# Patient Record
Sex: Female | Born: 2000
Health system: Southern US, Community
[De-identification: ages and names within clinical notes are randomized; demographics above are authoritative.]

## PROBLEM LIST (undated history)

## (undated) DIAGNOSIS — Z8669 Personal history of other diseases of the nervous system and sense organs: Secondary | ICD-10-CM

## (undated) HISTORY — PX: NO PAST SURGERIES: SHX2092

---
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
## 2021-01-30 ENCOUNTER — Ambulatory Visit
Admission: RE | Admit: 2021-01-30 | Discharge: 2021-01-30 | Disposition: A | Discharge status: Home / Self Care | Payer: BC Managed Care – PPO | Source: Ambulatory Visit | Attending: Emergency Medicine | Admitting: Emergency Medicine

## 2021-01-30 ENCOUNTER — Other Ambulatory Visit: Payer: Self-pay

## 2021-01-30 VITALS — BP 146/128 | HR 70 | Temp 98.7°F | Resp 18

## 2021-01-30 DIAGNOSIS — H00015 Hordeolum externum left lower eyelid: Secondary | ICD-10-CM

## 2021-01-30 MED ORDER — ERYTHROMYCIN 5 MG/GM OP OINT: TOPICAL_OINTMENT | OPHTHALMIC | 0 refills | Status: DC

## 2021-01-30 NOTE — ED Provider Notes (Signed)
[facility]    CSN: [id number] Arrival date & time: 01/30/21  [id number]      History   Chief Complaint Chief Complaint  Patient presents with   Eye Problem    HPI Chelsea Sanchez is a 20 y.o. female.   HPI  20 year old female here for evaluation of swollen left thigh.  Patient reports that she initially had swelling of her left upper eyelid 2 weeks ago that resulted in a stye.  The stye ruptured on its own and the swelling has mostly resolved.  Swelling to her lower eyelid with a stye forming near the inner canthus.  She has been using warm compresses without any improvement and the stye has not ruptured on its own.  She denies any changes to her vision.  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  She states her eye itches and the eyelid is painful.  Past Medical History:  Diagnosis Date   History of migraine     There are no problems to display for this patient.   Past Surgical History:  Procedure Laterality Date   NO PAST SURGERIES      OB History     Gravida  0   Para  0   Term  0   Preterm  0   AB  0   Living  0      SAB  0   IAB  0   Ectopic  0   Multiple  0   Live Births  0            Home Medications    Prior to Admission medications   Medication Sig Start Date End Date Taking? Authorizing Provider  erythromycin ophthalmic ointment Place a 1/2 inch ribbon of ointment into the lower eyelid. 01/30/21  Yes Margarette Canada, NP  etonogestrel (NEXPLANON) 68 MG IMPL implant 1 each by Subdermal route once. 07/12/20 07/13/23  [provider]    Family History Family History  Problem Relation Age of Onset   Healthy Mother    Multiple sclerosis Father    Hypertension Maternal Grandmother    Breast cancer Maternal Grandmother    Kidney disease Maternal Grandmother     Social History Social History   Tobacco Use   Smoking status: Never   Smokeless tobacco: Never   Vaping Use   Vaping Use: Former   Substances: Flavoring  Substance Use Topics   Alcohol use: Yes    Comment: occasional   Drug use: Never     Allergies   Copper-containing compounds and Nickel   Review of Systems Review of Systems  Constitutional:  Negative for activity change, appetite change and fever.  Eyes:  Positive for pain and itching. Negative for photophobia, discharge, redness and visual disturbance.  Skin:  Positive for color change.  Hematological: Negative.   Psychiatric/Behavioral: Negative.      Physical Exam Triage Vital Signs ED Triage Vitals  Enc Vitals Group     BP 01/30/21 0845 (!) 146/128     Pulse Rate 01/30/21 0845 70     Resp 01/30/21 0845 18     Temp 01/30/21 0845 98.7 F (37.1 C)     Temp Source 01/30/21 0845 Oral     SpO2 01/30/21 0845 100 %     Weight --      Height --      Head Circumference --      Peak  Flow --      Pain Score 01/30/21 0843 5     Pain Loc --      Pain Edu? --      Excl. in [place]? --    No data found.  Updated Vital Signs BP (!) 146/128 (BP Location: Right Arm)    Pulse 70    Temp 98.7 F (37.1 C) (Oral)    Resp 18    SpO2 100%   Visual Acuity Right Eye Distance:   Left Eye Distance:   Bilateral Distance:    Right Eye Near:   Left Eye Near:    Bilateral Near:     Physical Exam Vitals and nursing note reviewed.  Constitutional:      General: She is not in acute distress.    Appearance: Normal appearance. She is not ill-appearing.  HENT:     Head: Normocephalic and atraumatic.  Eyes:     General: No scleral icterus.       Right eye: No discharge.        Left eye: No discharge.     Extraocular Movements: Extraocular movements intact.     Conjunctiva/sclera: Conjunctivae normal.     Pupils: Pupils are equal, round, and reactive to light.  Skin:    General: Skin is warm and dry.     Capillary Refill: Capillary refill takes less than 2 seconds.     Findings: Erythema present.  Neurological:      General: No focal deficit present.     Mental Status: She is alert and oriented to person, place, and time.  Psychiatric:        Mood and Affect: Mood normal.        Behavior: Behavior normal.        Thought Content: Thought content normal.        Judgment: Judgment normal.     UC Treatments / Results  Labs (all labs ordered are listed, but only abnormal results are displayed) Labs Reviewed - No data to display  EKG   Radiology No results found.  Procedures Procedures (including critical care time)  Medications Ordered in UC Medications - No data to display  Initial Impression / Assessment and Plan / UC Course  I have reviewed the triage vital signs and the nursing notes.  Pertinent labs & imaging results that were available during my care of the patient were reviewed by me and considered in my medical decision making (see chart for details).  Patient is a nontoxic-appearing 20 year old female here for evaluation of swelling to her left lower eyelid.  She has been experiencing swelling to both eyelids for the past 2 weeks.  The upper eyelid initially developed into a stye, which ruptured on its own, and has largely resolved.  There is some remaining mild edema to the upper eyelid without erythema.  There is a small red mark along the eyelid margin where the stye once was.  There is no further lesions visualized along the eyelid margin or under the eyelid.  The left lower eyelid has erythema and edema that is more prominent near the inner canthus.  No fluctuance or induration noted along the outer perimeter of the eyelid but there is induration and fluctuance noted near the inner canthus.  There is an obvious hordeolum externum with a head that is formed.  The stye is tender to palpation.  I offered the patient the option of having the stye drained here in the clinic or continuing warm compresses at  home with the hopes that it would rupture on its own.  Patient states that she would  prefer to let rupture on its own so I will discharge her home on erythromycin eye ointment and have instructed her to perform eyelid hygiene twice daily with baby shampoo and vigorous eyelid scrubbing.  She is to play half-inch ribbon to the eyelid margin twice daily until the stye resolves.  I have informed her that if the swelling continues and the stye does not rupture on its own she needs to return for reevaluation and possible I&D or follow-up with her eye doctor.  Patient verbalizes understanding of same.  Work note provided.   Final Clinical Impressions(s) / UC Diagnoses   Final diagnoses:  Hordeolum externum of left lower eyelid     Discharge Instructions      Continue to apply warm compresses.  Perform eyelid hygiene with baby shampoo in a rich lather and vigorous scrubbing of your eyelids twice daily. Rinse thoroughly with water.  Apply Erythromycin ointment to the eyelid margin with a clean Q-tip twice daily, and a 1/2 inch ribbon to the inside of the lower lid. Blink several times to liquify the ointment and spread it ober the inside of your eyelids.  If your symptoms do not improve, or you develop changes in your vision follow up with your eye doctor.       ED Prescriptions     Medication Sig Dispense Auth. Provider   erythromycin ophthalmic ointment Place a 1/2 inch ribbon of ointment into the lower eyelid. 3.5 g Becky Augusta, NP      PDMP not reviewed this encounter.   Becky Augusta, NP 01/30/21 [phone]

## 2021-01-30 NOTE — Discharge Instructions (Signed)
Continue to apply warm compresses. ° °Perform eyelid hygiene with baby shampoo in a rich lather and vigorous scrubbing of your eyelids twice daily. Rinse thoroughly with water. ° °Apply Erythromycin ointment to the eyelid margin with a clean Q-tip twice daily, and a 1/2 inch ribbon to the inside of the lower lid. Blink several times to liquify the ointment and spread it ober the inside of your eyelids. ° °If your symptoms do not improve, or you develop changes in your vision follow up with your eye doctor.   °

## 2021-01-30 NOTE — ED Triage Notes (Signed)
Pt presents with swelling of eyelid x 2 weeks. States it started with swelling/stye on upper eye lid which drained then had start on lower lid.  Has used warm compresses and a cream with no improvement.  No vision changes.

## 2021-01-31 ENCOUNTER — Ambulatory Visit (INDEPENDENT_AMBULATORY_CARE_PROVIDER_SITE_OTHER): Payer: BC Managed Care – PPO | Admitting: Obstetrics and Gynecology

## 2021-01-31 ENCOUNTER — Encounter: Payer: Self-pay | Admitting: Obstetrics and Gynecology

## 2021-01-31 VITALS — BP 118/64 | Ht 64.0 in | Wt 127.0 lb

## 2021-01-31 DIAGNOSIS — Z30011 Encounter for initial prescription of contraceptive pills: Secondary | ICD-10-CM

## 2021-01-31 DIAGNOSIS — Z3046 Encounter for surveillance of implantable subdermal contraceptive: Secondary | ICD-10-CM

## 2021-01-31 MED ORDER — MICROGESTIN 24 FE 1-20 MG-MCG PO TABS: 1.0000 | ORAL_TABLET | Freq: Every day | ORAL | 2 refills | Status: DC

## 2021-01-31 NOTE — Progress Notes (Signed)
[place], [employer] Complaint  Patient presents with   Contraception    Nexplanon removal due to frequent headaches, tenderness in breasts, interested in OCPs    HPI:      [name] is a 20 y.o. G0P0000 whose LMP was No LMP recorded. Patient has had an implant., presents today for nexplanon removal due to frequent headaches and breast tenderness. Had migraines without aura before nexplanon, but less frequent. Nexplanon placed 6/22, instructed pt to give it time. Pt's sx not improved. Would like to do OCPs; never done them before. No hx of migraines with aura.  She is sex active.   Past Medical History:  Diagnosis Date   History of migraine     Past Surgical History:  Procedure Laterality Date   NO PAST SURGERIES      Family History  Problem Relation Age of Onset   Healthy Mother    Multiple sclerosis Father    Hypertension Maternal Grandmother    Breast cancer Maternal Grandmother        not sure of age   Kidney disease Maternal Grandmother     Social History   Socioeconomic History   Marital status: Single    Spouse name: Not on file   Number of children: Not on file   Years of education: Not on file   Highest education level: Not on file  Occupational History   Not on file  Tobacco Use   Smoking status: Never   Smokeless tobacco: Never  Vaping Use   Vaping Use: Former   Substances: Flavoring  Substance and Sexual Activity   Alcohol use: Yes    Comment: occasional   Drug use: Never   Sexual activity: Yes    Birth control/protection: Implant, Condom  Other Topics Concern   Not on file  Social History Narrative   Not on file   Social Determinants of Health   Financial Resource Strain: Not on file  Food Insecurity: Not on file  Transportation Needs: Not on file  Physical Activity: Not on file  Stress: Not on file  Social Connections: Not on file  Intimate Partner Violence: Not on file    Outpatient Medications  Prior to Visit  Medication Sig Dispense Refill   erythromycin ophthalmic ointment Place a 1/2 inch ribbon of ointment into the lower eyelid. 3.5 g 0   etonogestrel (NEXPLANON) 68 MG IMPL implant 1 each by Subdermal route once.     No facility-administered medications prior to visit.      ROS:  Review of Systems  Constitutional:  Negative for fever.  Gastrointestinal:  Negative for blood in stool, constipation, diarrhea, nausea and vomiting.  Genitourinary:  Negative for dyspareunia, dysuria, flank pain, frequency, hematuria, urgency, vaginal bleeding, vaginal discharge and vaginal pain.  Musculoskeletal:  Negative for back pain.  Skin:  Negative for rash.  Neurological:  Positive for headaches.  BREAST: No symptoms   OBJECTIVE:   Vitals:  BP 118/64    Ht 5\' 4"  (1.626 m)    Wt 127 lb (57.6 kg)    BMI 21.80 kg/m   Physical Exam Constitutional:      Appearance: Normal appearance.  Pulmonary:     Effort: Pulmonary effort is normal.  Musculoskeletal:        General: Normal range of motion.  Neurological:     Mental Status: She is alert and oriented to person, place, and time.  Psychiatric:  Judgment: Judgment normal.    Nexplanon removal Procedure note - The Nexplanon was noted in the patient's arm and the end was identified. The skin was cleansed with a Betadine solution. A small injection of subcutaneous lidocaine with epinephrine was given over the end of the implant. An incision was made at the end of the implant. The rod was noted in the incision and grasped with a hemostat. It was noted to be intact.  Steri-Strip was placed approximating the incision. Hemostasis was noted.  Assessment/Plan: Encounter for initial prescription of contraceptive pills - Plan: Norethindrone Acetate-Ethinyl Estrad-FE (MICROGESTIN 24 FE) 1-20 MG-MCG(24) tablet; OCP start today, condoms for 1 mo. Rx eRxd. F/u prn/RTO for 6/23 annual.   Nexplanon removal--Remove the dressing in 24 hours,   keep the incision area dry for 24 hours and remove the Steristrip in 2-3  days.  Notify us if any signs of tenderness, redness, pain, or fevers develop.   Meds ordered this encounter  Medications   Norethindrone Acetate-Ethinyl Estrad-FE (MICROGESTIN 24 FE) 1-20 MG-MCG(24) tablet    Sig: Take 1 tablet by mouth daily.    Dispense:  84 tablet    Refill:  2    Order Specific Question:   Supervising Provider    Answer:   Nadara Mustard [[id number]]      Return if symptoms worsen or fail to improve.  Monnica Saltsman B. Dary Dilauro, PA-C 01/31/2021 4:17 PM

## 2021-01-31 NOTE — Patient Instructions (Signed)
I value your feedback and you entrusting us with your care. If you get a [facility] patient survey, I would appreciate you taking the time to let us know about your experience today. Thank you!  Remove the dressing in 24 hours,  keep the incision area dry for 24 hours and remove the Steristrip in 2-3  days.  Notify us if any signs of tenderness, redness, pain, or fevers develop.   

## 2021-02-16 ENCOUNTER — Ambulatory Visit
Admission: EM | Admit: 2021-02-16 | Discharge: 2021-02-16 | Disposition: A | Discharge status: Home / Self Care | Payer: BC Managed Care – PPO | Attending: Physician Assistant | Admitting: Physician Assistant

## 2021-02-16 ENCOUNTER — Encounter: Payer: Self-pay | Admitting: Emergency Medicine

## 2021-02-16 ENCOUNTER — Other Ambulatory Visit: Payer: Self-pay

## 2021-02-16 DIAGNOSIS — R300 Dysuria: Secondary | ICD-10-CM | POA: Diagnosis not present

## 2021-02-16 DIAGNOSIS — N3001 Acute cystitis with hematuria: Secondary | ICD-10-CM | POA: Insufficient documentation

## 2021-02-16 LAB — URINALYSIS, COMPLETE (UACMP) WITH MICROSCOPIC
Bilirubin Urine: NEGATIVE
Glucose, UA: NEGATIVE mg/dL
Ketones, ur: NEGATIVE mg/dL
Nitrite: NEGATIVE
Protein, ur: 100 mg/dL — AB
RBC / HPF: >50 RBC/hpf (ref 0–5)
Specific Gravity, Urine: >1.03 — ABNORMAL HIGH (ref 1.005–1.030)
pH: 5.5 (ref 5.0–8.0)

## 2021-02-16 LAB — PREGNANCY, URINE: Preg Test, Ur: NEGATIVE

## 2021-02-16 MED ORDER — PHENAZOPYRIDINE HCL 200 MG PO TABS: 200.0000 mg | ORAL_TABLET | Freq: Three times a day (TID) | ORAL | 0 refills | Status: DC

## 2021-02-16 MED ORDER — CEPHALEXIN 500 MG PO CAPS: 500.0000 mg | ORAL_CAPSULE | Freq: Four times a day (QID) | ORAL | 0 refills | Status: DC

## 2021-02-16 MED ORDER — FLUCONAZOLE 150 MG PO TABS: 150.0000 mg | ORAL_TABLET | Freq: Every day | ORAL | 0 refills | Status: DC

## 2021-02-16 MED ORDER — PHENAZOPYRIDINE HCL 200 MG PO TABS
200.0000 mg | ORAL_TABLET | Freq: Once | ORAL | Status: AC
  Administered 2021-02-16: 200 mg via ORAL

## 2021-02-16 NOTE — Discharge Instructions (Addendum)
Rest, push fluids, take antibiotic as directed.  Pyridium will make your urine turn orange.  Follow-up with your PCP next week for recheck.  Go to ER if you develop nausea, vomiting, fever, flank pain, etc.

## 2021-02-16 NOTE — ED Provider Notes (Signed)
[facility]    CSN: [id number] Arrival date & time: 02/16/21  0936      History   Chief Complaint Chief Complaint  Patient presents with   Dysuria    HPI ONELL CHRISTENSEN is a 21 y.o. female.   Audry Riles, 21 year old female, presents to urgent care with chief complaint of dysuria, hematuria, frequency that started last night, recently changed birth control, denies vaginal bleeding or STI concerns. No prior UTI.  Patient states she is taking AZO cranberry without relief.  The history is provided by the patient. No language interpreter was used.   Past Medical History:  Diagnosis Date   History of migraine     Patient Active Problem List   Diagnosis Date Noted   Dysuria 02/16/2021   Acute cystitis with hematuria 02/16/2021    Past Surgical History:  Procedure Laterality Date   NO PAST SURGERIES      OB History     Gravida  0   Para  0   Term  0   Preterm  0   AB  0   Living  0      SAB  0   IAB  0   Ectopic  0   Multiple  0   Live Births  0            Home Medications    Prior to Admission medications   Medication Sig Start Date End Date Taking? Authorizing Provider  cephALEXin (KEFLEX) 500 MG capsule Take 1 capsule (500 mg total) by mouth 4 (four) times daily. [id number]  Yes Mohsin Crum, Jeanett Schlein, NP  fluconazole (DIFLUCAN) 150 MG tablet Take 1 tablet (150 mg total) by mouth daily. Take day 0, 3,7 [id number]  Yes Barby Colvard, Jeanett Schlein, NP  Norethindrone Acetate-Ethinyl Estrad-FE (MICROGESTIN 24 FE) 1-20 MG-MCG(24) tablet Take 1 tablet by mouth daily. [id number]  Yes Copland, Deirdre Evener, PA-C  phenazopyridine (PYRIDIUM) 200 MG tablet Take 1 tablet (200 mg total) by mouth 3 (three) times daily. [id number]  Yes Khristian Phillippi, Jeanett Schlein, NP  erythromycin ophthalmic ointment Place a 1/2 inch ribbon of ointment into the lower eyelid. 01/30/21   Margarette Canada, NP  etonogestrel (NEXPLANON) 68 MG IMPL implant 1 each by Subdermal route once. 07/12/20  07/13/23  [provider]    Family History Family History  Problem Relation Age of Onset   Healthy Mother    Multiple sclerosis Father    Hypertension Maternal Grandmother    Breast cancer Maternal Grandmother        not sure of age   Kidney disease Maternal Grandmother     Social History Social History   Tobacco Use   Smoking status: Never   Smokeless tobacco: Never  Vaping Use   Vaping Use: Former   Substances: Flavoring  Substance Use Topics   Alcohol use: Yes    Comment: occasional   Drug use: Never     Allergies   Copper-containing compounds and Nickel   Review of Systems Review of Systems  Constitutional:  Negative for chills.  HENT:  Negative for ear pain and sore throat.   Eyes:  Negative for pain and visual disturbance.  Respiratory:  Negative for cough and shortness of breath.   Cardiovascular:  Negative for chest pain and palpitations.  Gastrointestinal:  Negative for abdominal pain and vomiting.  Genitourinary:  Positive for dysuria. Negative for hematuria, vaginal bleeding and vaginal discharge.  Musculoskeletal:  Negative for arthralgias and back pain.  Skin:  Negative  for color change and rash.  Neurological:  Negative for seizures and syncope.  All other systems reviewed and are negative.   Physical Exam Triage Vital Signs ED Triage Vitals  Enc Vitals Group     BP 02/16/21 0956 106/70     Pulse Rate 02/16/21 0956 73     Resp 02/16/21 0956 18     Temp 02/16/21 0956 98.7 F (37.1 C)     Temp Source 02/16/21 0956 Oral     SpO2 02/16/21 0956 98 %     Weight 02/16/21 0954 126 lb 15.8 oz (57.6 kg)     Height 02/16/21 0954 5\' 4"  (1.626 m)     Head Circumference --      Peak Flow --      Pain Score 02/16/21 0953 10     Pain Loc --      Pain Edu? --      Excl. in [place]? --    No data found.  Updated Vital Signs BP 106/70 (BP Location: Right Arm)    Pulse 73    Temp 98.7 F (37.1 C) (Oral)    Resp 18    Ht 5\' 4"  (1.626 m)    Wt 126  lb 15.8 oz (57.6 kg)    SpO2 98%    BMI 21.80 kg/m   Visual Acuity Right Eye Distance:   Left Eye Distance:   Bilateral Distance:    Right Eye Near:   Left Eye Near:    Bilateral Near:     Physical Exam Vitals and nursing note reviewed.  Constitutional:      General: She is not in acute distress.    Appearance: Normal appearance. She is well-developed and well-groomed.  HENT:     Head: Normocephalic.  Eyes:     Pupils: Pupils are equal, round, and reactive to light.  Cardiovascular:     Rate and Rhythm: Normal rate and regular rhythm.     Pulses: Normal pulses.     Heart sounds: Normal heart sounds.  Pulmonary:     Effort: Pulmonary effort is normal.     Breath sounds: Normal breath sounds and air entry.  Abdominal:     General: Bowel sounds are normal.     Palpations: Abdomen is soft.     Tenderness: There is no abdominal tenderness.  Musculoskeletal:        General: Normal range of motion.     Cervical back: Normal range of motion.  Skin:    General: Skin is warm and dry.  Neurological:     General: No focal deficit present.     Mental Status: She is alert and oriented to person, place, and time.     GCS: GCS eye subscore is 4. GCS verbal subscore is 5. GCS motor subscore is 6.  Psychiatric:        Attention and Perception: Attention normal.        Mood and Affect: Mood normal.        Speech: Speech normal.        Behavior: Behavior normal. Behavior is cooperative.     UC Treatments / Results  Labs (all labs ordered are listed, but only abnormal results are displayed) Labs Reviewed  URINALYSIS, COMPLETE (UACMP) WITH MICROSCOPIC - Abnormal; Notable for the following components:      Result Value   Color, Urine AMBER (*)    APPearance CLOUDY (*)    Specific Gravity, Urine >1.030 (*)    Hgb urine dipstick  LARGE (*)    Protein, ur 100 (*)    Leukocytes,Ua SMALL (*)    Bacteria, UA MANY (*)    All other components within normal limits  URINE CULTURE   PREGNANCY, URINE    EKG   Radiology No results found.  Procedures Procedures (including critical care time)  Medications Ordered in UC Medications  phenazopyridine (PYRIDIUM) tablet 200 mg (200 mg Oral Given 02/16/21 1051)    Initial Impression / Assessment and Plan / UC Course  I have reviewed the triage vital signs and the nursing notes.  Pertinent labs & imaging results that were available during my care of the patient were reviewed by me and considered in my medical decision making (see chart for details).     Ddx: UTI, kidney stone, pyelonephritis Final Clinical Impressions(s) / UC Diagnoses   Final diagnoses:  Dysuria  Acute cystitis with hematuria     Discharge Instructions      Rest, push fluids, take antibiotic as directed.  Pyridium will make your urine turn orange.  Follow-up with your PCP next week for recheck.  Go to ER if you develop nausea, vomiting, fever, flank pain, etc.     ED Prescriptions     Medication Sig Dispense Auth. Provider   cephALEXin (KEFLEX) 500 MG capsule Take 1 capsule (500 mg total) by mouth 4 (four) times daily. 40 capsule Kerria Sapien, NP   phenazopyridine (PYRIDIUM) 200 MG tablet Take 1 tablet (200 mg total) by mouth 3 (three) times daily. 6 tablet Donnalyn Juran, NP   fluconazole (DIFLUCAN) 150 MG tablet Take 1 tablet (150 mg total) by mouth daily. Take day 0, 3,7 3 tablet Lennox Leikam, Jeanett Schlein, NP      PDMP not reviewed this encounter.   Tori Milks, NP [id number]

## 2021-02-16 NOTE — ED Triage Notes (Signed)
Pt c/o dysuria, hematuria, urinary frequency. Started last night. Denies fever, lower back or pelvis pain.

## 2021-02-18 LAB — URINE CULTURE: Culture: 80000 — AB

## 2021-07-09 ENCOUNTER — Ambulatory Visit
Admission: EM | Admit: 2021-07-09 | Discharge: 2021-07-09 | Disposition: A | Discharge status: Home / Self Care | Payer: BC Managed Care – PPO | Attending: Internal Medicine | Admitting: Internal Medicine

## 2021-07-09 DIAGNOSIS — M5442 Lumbago with sciatica, left side: Secondary | ICD-10-CM | POA: Diagnosis present

## 2021-07-09 DIAGNOSIS — M5441 Lumbago with sciatica, right side: Secondary | ICD-10-CM | POA: Insufficient documentation

## 2021-07-09 LAB — URINALYSIS, ROUTINE W REFLEX MICROSCOPIC
Bilirubin Urine: NEGATIVE
Glucose, UA: NEGATIVE mg/dL
Hgb urine dipstick: NEGATIVE
Ketones, ur: NEGATIVE mg/dL
Leukocytes,Ua: NEGATIVE
Nitrite: NEGATIVE
Protein, ur: NEGATIVE mg/dL
Specific Gravity, Urine: 1.02 (ref 1.005–1.030)
pH: 5.5 (ref 5.0–8.0)

## 2021-07-09 MED ORDER — CYCLOBENZAPRINE HCL 10 MG PO TABS: 10.0000 mg | ORAL_TABLET | Freq: Every evening | ORAL | 0 refills | Status: AC | PRN

## 2021-07-09 NOTE — ED Triage Notes (Signed)
Patient c.o sharpe pain in her lower back and lower abdomin -- "about 2 weeks now"

## 2021-07-09 NOTE — ED Provider Notes (Signed)
[facility]    CSN: [id number] Arrival date & time: 07/09/21  1027      History   Chief Complaint Chief Complaint  Patient presents with  . Back Pain  . Abdominal Pain    HPI Chelsea Sanchez is a 21 y.o. female.    Back Pain Associated symptoms: abdominal pain   Abdominal Pain  Past Medical History:  Diagnosis Date  . History of migraine     Patient Active Problem List   Diagnosis Date Noted  . Dysuria 02/16/2021  . Acute cystitis with hematuria 02/16/2021    Past Surgical History:  Procedure Laterality Date  . NO PAST SURGERIES      OB History     Gravida  0   Para  0   Term  0   Preterm  0   AB  0   Living  0      SAB  0   IAB  0   Ectopic  0   Multiple  0   Live Births  0            Home Medications    Prior to Admission medications   Medication Sig Start Date End Date Taking? Authorizing Provider  cyclobenzaprine (FLEXERIL) 10 MG tablet Take 1 tablet (10 mg total) by mouth at bedtime as needed for muscle spasms. 07/09/21 08/08/21 Yes Isa Rankin, MD  Norethindrone Acetate-Ethinyl Estrad-FE (MICROGESTIN 24 FE) 1-20 MG-MCG(24) tablet Take 1 tablet by mouth daily. 01/31/21   Copland, Ilona Sorrel, PA-C  phenazopyridine (PYRIDIUM) 200 MG tablet Take 1 tablet (200 mg total) by mouth 3 (three) times daily. 02/16/21   Defelice, Para March, NP    Family History Family History  Problem Relation Age of Onset  . Healthy Mother   . Multiple sclerosis Father   . Hypertension Maternal Grandmother   . Breast cancer Maternal Grandmother        not sure of age  . Kidney disease Maternal Grandmother     Social History Social History   Tobacco Use  . Smoking status: Never  . Smokeless tobacco: Never  Vaping Use  . Vaping Use: Former  . Substances: Flavoring  Substance Use Topics  . Alcohol use: Yes    Comment: occasional  . Drug use: Never     Allergies   Copper-containing compounds and Nickel   Review  of Systems Review of Systems  Gastrointestinal:  Positive for abdominal pain.  Musculoskeletal:  Positive for back pain.    Physical Exam Triage Vital Signs ED Triage Vitals  Enc Vitals Group     BP 07/09/21 1100 109/64     Pulse Rate 07/09/21 1100 80     Resp 07/09/21 1100 18     Temp 07/09/21 1100 98.2 F (36.8 C)     Temp Source 07/09/21 1100 Oral     SpO2 07/09/21 1100 99 %     Weight --      Height 07/09/21 1058 5\' 4"  (1.626 m)     Head Circumference --      Peak Flow --      Pain Score 07/09/21 1058 8     Pain Loc --      Pain Edu? --      Excl. in [place]? --    No data found.  Updated Vital Signs BP 109/64 (BP Location: Right Arm)   Pulse 80   Temp 98.2 F (36.8 C) (Oral)   Resp 18   Ht  5\' 4"  (1.626 m)   LMP 06/25/2021 (Approximate)   SpO2 99%   BMI 21.80 kg/m   Visual Acuity Right Eye Distance:   Left Eye Distance:   Bilateral Distance:    Right Eye Near:   Left Eye Near:    Bilateral Near:     Physical Exam   UC Treatments / Results  Labs (all labs ordered are listed, but only abnormal results are displayed) Labs Reviewed  URINALYSIS, ROUTINE W REFLEX MICROSCOPIC    EKG   Radiology No results found.  Procedures Procedures (including critical care time)  Medications Ordered in UC Medications - No data to display  Initial Impression / Assessment and Plan / UC Course  I have reviewed the triage vital signs and the nursing notes.  Pertinent labs & imaging results that were available during my care of the patient were reviewed by me and considered in my medical decision making (see chart for details).     *** Final Clinical Impressions(s) / UC Diagnoses   Final diagnoses:  Acute midline low back pain with bilateral sciatica     Discharge Instructions      Symptoms and exam today are reassuring--no danger signs.  Urine test was negative for evidence of infection.  Prescription for cyclobenzaprine (muscle relaxer, also helps  sleep) was sent to the pharmacy.  Take ibuprofen or tylenol as needed during the day, to help with discomfort.  Ice or heat to the sore spots, for 5-10 minutes several times daily, may also be helpful for decreasing discomfort.  We held off on xrays today because no clinical suspicion for a broken or fractured bone.  I would recommend physical therapy to help strengthen the core muscles in the trunk, partly to help resolve this episode and mostly to protect against future episodes of back pain.  [facility]--[phone], or see other contact number for [facility] above.     ED Prescriptions     Medication Sig Dispense Auth. Provider   cyclobenzaprine (FLEXERIL) 10 MG tablet Take 1 tablet (10 mg total) by mouth at bedtime as needed for muscle spasms. 15 tablet [facility], MD      I have reviewed the PDMP during this encounter.

## 2021-07-09 NOTE — Discharge Instructions (Addendum)
Symptoms and exam today are reassuring--no danger signs.  Urine test was negative for evidence of infection.  Prescription for cyclobenzaprine (muscle relaxer, also helps sleep) was sent to the pharmacy.  Take ibuprofen or tylenol as needed during the day, to help with discomfort.  Ice or heat to the sore spots, for 5-10 minutes several times daily, may also be helpful for decreasing discomfort.  We held off on xrays today because no clinical suspicion for a broken or fractured bone.  I would recommend physical therapy to help strengthen the core muscles in the trunk, partly to help resolve this episode and mostly to protect against future episodes of back pain.  [facility]--[phone], or see other contact number for [facility] above.

## 2021-10-17 ENCOUNTER — Other Ambulatory Visit: Payer: Self-pay | Admitting: Obstetrics and Gynecology

## 2021-10-17 DIAGNOSIS — Z30011 Encounter for initial prescription of contraceptive pills: Secondary | ICD-10-CM

## 2022-03-18 ENCOUNTER — Telehealth: Payer: Self-pay

## 2022-03-18 NOTE — Telephone Encounter (Signed)
Pt calling to schedule an annual exam appt.  [phone]

## 2022-03-19 NOTE — Telephone Encounter (Signed)
I contacted patient via phone. Call couldn't be completed as dialed. Unable to leave message. 

## 2022-03-24 NOTE — Telephone Encounter (Signed)
I contacted patient via phone. Call couldn't be completed as dialed. Unable to leave message.

## 2022-05-07 NOTE — Telephone Encounter (Signed)
Pt is scheduled with [facility] on 05/12/2022.

## 2022-05-08 NOTE — Progress Notes (Signed)
PCP:  [place]   Chief Complaint  Patient presents with   Gynecologic Exam    No concerns     HPI:      [name] is a 22 y.o. G0P0000 whose LMP was Patient's last menstrual period was 04/21/2022 (approximate)., presents today for her annual examination.  Her menses are regular every 28-30 days, lasting 4-5 days.  No BTB, mild dysmen, improved with NSAIDs.    Sex activity: single partner, contraception - OCP (estrogen/progesterone). Likes OCPs better than nexplanon; removed 12/23 due to increased headaches and breast tenderness. Hx of migraines without aura.  Last Pap: N/A due to age Hx of STDs: none  There is a FH of breast cancer in her MGM, genetic testing not indicated. There is no FH of ovarian cancer. The patient does do self-breast exams.  Tobacco use: The patient denies current or previous tobacco use. Alcohol use: social drinker No drug use.  Exercise: not active  She does not get adequate calcium and Vitamin D in her diet.  Patient Active Problem List   Diagnosis Date Noted   Dysuria 02/16/2021   Acute cystitis with hematuria 02/16/2021    Past Surgical History:  Procedure Laterality Date   NO PAST SURGERIES      Family History  Problem Relation Age of Onset   Healthy Mother    Multiple sclerosis Father    Hypertension Maternal Grandmother    Breast cancer Maternal Grandmother        not sure of age   Kidney disease Maternal Grandmother     Social History   Socioeconomic History   Marital status: Single    Spouse name: Not on file   Number of children: Not on file   Years of education: Not on file   Highest education level: Not on file  Occupational History   Not on file  Tobacco Use   Smoking status: Never   Smokeless tobacco: Never  Vaping Use   Vaping Use: Former   Substances: Flavoring  Substance and Sexual Activity   Alcohol use: Yes    Comment: occasional   Drug use: Never   Sexual activity: Yes     Birth control/protection: Condom, Pill  Other Topics Concern   Not on file  Social History Narrative   Not on file   Social Determinants of Health   Financial Resource Strain: Not on file  Food Insecurity: Not on file  Transportation Needs: Not on file  Physical Activity: Not on file  Stress: Not on file  Social Connections: Not on file  Intimate Partner Violence: Not on file     Current Outpatient Medications:    Norethindrone Acetate-Ethinyl Estrad-FE (MICROGESTIN 24 FE) 1-20 MG-MCG(24) tablet, Take 1 tablet by mouth daily., Disp: 84 tablet, Rfl: 3     ROS:  Review of Systems  Constitutional:  Negative for fatigue, fever and unexpected weight change.  Respiratory:  Negative for cough, shortness of breath and wheezing.   Cardiovascular:  Negative for chest pain, palpitations and leg swelling.  Gastrointestinal:  Negative for blood in stool, constipation, diarrhea, nausea and vomiting.  Endocrine: Negative for cold intolerance, heat intolerance and polyuria.  Genitourinary:  Negative for dyspareunia, dysuria, flank pain, frequency, genital sores, hematuria, menstrual problem, pelvic pain, urgency, vaginal bleeding, vaginal discharge and vaginal pain.  Musculoskeletal:  Negative for back pain, joint swelling and myalgias.  Skin:  Negative for rash.  Neurological:  Positive for headaches. Negative for dizziness, syncope, light-headedness  and numbness.  Hematological:  Negative for adenopathy.  Psychiatric/Behavioral:  Negative for agitation, confusion, sleep disturbance and suicidal ideas. The patient is not nervous/anxious.    BREAST: No symptoms   Objective: BP 100/60   Ht 5\' 4"  (1.626 m)   Wt 118 lb (53.5 kg)   LMP 04/21/2022 (Approximate)   BMI 20.25 kg/m    Physical Exam Constitutional:      Appearance: She is well-developed.  Genitourinary:     Vulva normal.     Right Labia: No rash, tenderness or lesions.    Left Labia: No tenderness, lesions or rash.     No vaginal discharge, erythema or tenderness.      Right Adnexa: not tender and no mass present.    Left Adnexa: not tender and no mass present.    No cervical friability or polyp.     Uterus is not enlarged or tender.  Breasts:    Right: No mass, nipple discharge, skin change or tenderness.     Left: No mass, nipple discharge, skin change or tenderness.  Neck:     Thyroid: No thyromegaly.  Cardiovascular:     Rate and Rhythm: Normal rate and regular rhythm.     Heart sounds: Normal heart sounds. No murmur heard. Pulmonary:     Effort: Pulmonary effort is normal.     Breath sounds: Normal breath sounds.  Abdominal:     Palpations: Abdomen is soft.     Tenderness: There is no abdominal tenderness. There is no guarding or rebound.  Musculoskeletal:        General: Normal range of motion.     Cervical back: Normal range of motion.  Lymphadenopathy:     Cervical: No cervical adenopathy.  Neurological:     General: No focal deficit present.     Mental Status: She is alert and oriented to person, place, and time.     Cranial Nerves: No cranial nerve deficit.  Skin:    General: Skin is warm and dry.  Psychiatric:        Mood and Affect: Mood normal.        Behavior: Behavior normal.        Thought Content: Thought content normal.        Judgment: Judgment normal.  Vitals reviewed.     Assessment/Plan: Encounter for annual routine gynecological examination  Cervical cancer screening - Plan: Cytology - PAP  Screening for STD (sexually transmitted disease) - Plan: Cytology - PAP  Encounter for surveillance of contraceptive pills - Plan: Norethindrone Acetate-Ethinyl Estrad-FE (MICROGESTIN 24 FE) 1-20 MG-MCG(24) tablet; OCP RF; discussed non-daily options prn  Meds ordered this encounter  Medications   Norethindrone Acetate-Ethinyl Estrad-FE (MICROGESTIN 24 FE) 1-20 MG-MCG(24) tablet    Sig: Take 1 tablet by mouth daily.    Dispense:  84 tablet    Refill:  3    Order  Specific Question:   Supervising Provider    Answer:   Renaldo Reel             GYN counsel adequate intake of calcium and vitamin D, diet and exercise     F/U  Return in about 1 year (around 05/12/2023).  Glorene Leitzke B. Ules Marsala, PA-C 05/12/2022 10:59 AM

## 2022-05-12 ENCOUNTER — Encounter: Payer: Self-pay | Admitting: Obstetrics and Gynecology

## 2022-05-12 ENCOUNTER — Ambulatory Visit (INDEPENDENT_AMBULATORY_CARE_PROVIDER_SITE_OTHER): Payer: BC Managed Care – PPO | Admitting: Obstetrics and Gynecology

## 2022-05-12 ENCOUNTER — Other Ambulatory Visit (HOSPITAL_COMMUNITY)
Admission: RE | Admit: 2022-05-12 | Discharge: 2022-05-12 | Disposition: A | Discharge status: Home / Self Care | Payer: BC Managed Care – PPO | Source: Ambulatory Visit | Attending: Obstetrics and Gynecology | Admitting: Obstetrics and Gynecology

## 2022-05-12 VITALS — BP 100/60 | Ht 64.0 in | Wt 118.0 lb

## 2022-05-12 DIAGNOSIS — Z113 Encounter for screening for infections with a predominantly sexual mode of transmission: Secondary | ICD-10-CM | POA: Diagnosis present

## 2022-05-12 DIAGNOSIS — Z01419 Encounter for gynecological examination (general) (routine) without abnormal findings: Secondary | ICD-10-CM

## 2022-05-12 DIAGNOSIS — Z3041 Encounter for surveillance of contraceptive pills: Secondary | ICD-10-CM

## 2022-05-12 DIAGNOSIS — A749 Chlamydial infection, unspecified: Secondary | ICD-10-CM

## 2022-05-12 DIAGNOSIS — Z124 Encounter for screening for malignant neoplasm of cervix: Secondary | ICD-10-CM

## 2022-05-12 MED ORDER — MICROGESTIN 24 FE 1-20 MG-MCG PO TABS: 1.0000 | ORAL_TABLET | Freq: Every day | ORAL | 3 refills | Status: DC

## 2022-05-12 NOTE — Patient Instructions (Signed)
I value your feedback and you entrusting us with your care. If you get a [place] patient survey, I would appreciate you taking the time to let us know about your experience today. Thank you! ? ? ?

## 2022-05-14 LAB — CYTOLOGY - PAP
Chlamydia: POSITIVE — AB
Comment: NEGATIVE
Comment: NORMAL
Diagnosis: NEGATIVE
Neisseria Gonorrhea: NEGATIVE

## 2022-05-14 MED ORDER — AZITHROMYCIN 500 MG PO TABS: 1000.0000 mg | ORAL_TABLET | Freq: Once | ORAL | 0 refills | Status: AC

## 2022-05-14 NOTE — Progress Notes (Signed)
Pls notify [facility]. Thx

## 2022-05-14 NOTE — Addendum Note (Signed)
Addended by: Ardeth Perfect B on: [id number] 09:59 PM   Modules accepted: Orders

## 2022-05-15 ENCOUNTER — Encounter: Payer: Self-pay | Admitting: Emergency Medicine

## 2022-05-15 ENCOUNTER — Ambulatory Visit
Admission: EM | Admit: 2022-05-15 | Discharge: 2022-05-15 | Disposition: A | Discharge status: Home / Self Care | Payer: BC Managed Care – PPO | Attending: Physician Assistant | Admitting: Physician Assistant

## 2022-05-15 DIAGNOSIS — N3001 Acute cystitis with hematuria: Secondary | ICD-10-CM | POA: Insufficient documentation

## 2022-05-15 DIAGNOSIS — Z3202 Encounter for pregnancy test, result negative: Secondary | ICD-10-CM | POA: Insufficient documentation

## 2022-05-15 DIAGNOSIS — R300 Dysuria: Secondary | ICD-10-CM

## 2022-05-15 LAB — URINALYSIS, W/ REFLEX TO CULTURE (INFECTION SUSPECTED)
Bilirubin Urine: NEGATIVE
Glucose, UA: NEGATIVE mg/dL
Ketones, ur: 40 mg/dL — AB
Nitrite: NEGATIVE
Protein, ur: 100 mg/dL — AB
Specific Gravity, Urine: 1.025 (ref 1.005–1.030)
pH: 6 (ref 5.0–8.0)

## 2022-05-15 LAB — PREGNANCY, URINE: Preg Test, Ur: NEGATIVE

## 2022-05-15 MED ORDER — NITROFURANTOIN MONOHYD MACRO 100 MG PO CAPS: 100.0000 mg | ORAL_CAPSULE | Freq: Two times a day (BID) | ORAL | 0 refills | Status: DC

## 2022-05-15 NOTE — ED Triage Notes (Signed)
Pt c/o urinary frequency, dysuria, and lower abdominal pain. Started about 2 days ago. Denies fever or lower back pain.

## 2022-05-15 NOTE — ED Provider Notes (Signed)
[facility]    CSN: [id number] Arrival date & time: 05/15/22  1324      History   Chief Complaint Chief Complaint  Patient presents with   Hematuria   Urinary Frequency         HPI Chelsea Sanchez is a 22 y.o. female presenting for for 2-day history of suprapubic discomfort, urinary frequency/urgency, pinkish tint to urine and painful urination.  Denies fever, fatigue, flank pain, nausea/vomiting.  Last menstrual period was 04/21/2022.  Reports that she recently missed some of her birth control pills and restarted them a few days ago.  Unsure of potential pregnancy.  No reported vaginal discharge or odor.  No concern for STIs.  No other complaints.  HPI  Past Medical History:  Diagnosis Date   History of migraine     Patient Active Problem List   Diagnosis Date Noted   Dysuria 02/16/2021   Acute cystitis with hematuria 02/16/2021    Past Surgical History:  Procedure Laterality Date   NO PAST SURGERIES      OB History     Gravida  0   Para  0   Term  0   Preterm  0   AB  0   Living  0      SAB  0   IAB  0   Ectopic  0   Multiple  0   Live Births  0            Home Medications    Prior to Admission medications   Medication Sig Start Date End Date Taking? Authorizing Provider  nitrofurantoin, macrocrystal-monohydrate, (MACROBID) 100 MG capsule Take 1 capsule (100 mg total) by mouth 2 (two) times daily. 05/15/22  Yes Laurene Footman B, PA-C  Norethindrone Acetate-Ethinyl Estrad-FE (MICROGESTIN 24 FE) 1-20 MG-MCG(24) tablet Take 1 tablet by mouth daily. [id number]  Yes Copland, Deirdre Evener, PA-C    Family History Family History  Problem Relation Age of Onset   Healthy Mother    Multiple sclerosis Father    Hypertension Maternal Grandmother    Breast cancer Maternal Grandmother        not sure of age   Kidney disease Maternal Grandmother     Social History Social History   Tobacco Use   Smoking status: Never   Smokeless  tobacco: Never  Vaping Use   Vaping Use: Former   Substances: Flavoring  Substance Use Topics   Alcohol use: Yes    Comment: occasional   Drug use: Never     Allergies   Copper-containing compounds and Nickel   Review of Systems Review of Systems  Constitutional:  Negative for chills, fatigue and fever.  Gastrointestinal:  Positive for abdominal pain. Negative for diarrhea, nausea and vomiting.  Genitourinary:  Positive for dysuria, frequency, hematuria and urgency. Negative for decreased urine volume, flank pain, pelvic pain, vaginal bleeding, vaginal discharge and vaginal pain.  Musculoskeletal:  Negative for back pain.  Skin:  Negative for rash.     Physical Exam Triage Vital Signs ED Triage Vitals  Enc Vitals Group     BP 05/15/22 1341 105/72     Pulse Rate 05/15/22 1341 87     Resp 05/15/22 1341 16     Temp 05/15/22 1341 98.2 F (36.8 C)     Temp Source 05/15/22 1341 Oral     SpO2 05/15/22 1341 100 %     Weight 05/15/22 1340 117 lb 15.1 oz (53.5 kg)  Height 05/15/22 1340 5\' 4"  (1.626 m)     Head Circumference --      Peak Flow --      Pain Score 05/15/22 1339 3     Pain Loc --      Pain Edu? --      Excl. in [place]? --    No data found.  Updated Vital Signs BP 105/72 (BP Location: Left Arm)   Pulse 87   Temp 98.2 F (36.8 C) (Oral)   Resp 16   Ht 5\' 4"  (1.626 m)   Wt 117 lb 15.1 oz (53.5 kg)   LMP 04/21/2022 (Approximate)   SpO2 100%   BMI 20.25 kg/m       Physical Exam Vitals and nursing note reviewed.  Constitutional:      General: She is not in acute distress.    Appearance: Normal appearance. She is not ill-appearing or toxic-appearing.  HENT:     Head: Normocephalic and atraumatic.  Eyes:     General: No scleral icterus.       Right eye: No discharge.        Left eye: No discharge.     Conjunctiva/sclera: Conjunctivae normal.  Cardiovascular:     Rate and Rhythm: Normal rate and regular rhythm.     Heart sounds: Normal heart sounds.   Pulmonary:     Effort: Pulmonary effort is normal. No respiratory distress.     Breath sounds: Normal breath sounds.  Abdominal:     Palpations: Abdomen is soft.     Tenderness: There is no abdominal tenderness. There is no right CVA tenderness or left CVA tenderness.  Musculoskeletal:     Cervical back: Neck supple.  Skin:    General: Skin is dry.  Neurological:     General: No focal deficit present.     Mental Status: She is alert. Mental status is at baseline.     Motor: No weakness.     Gait: Gait normal.  Psychiatric:        Mood and Affect: Mood normal.        Behavior: Behavior normal.        Thought Content: Thought content normal.      UC Treatments / Results  Labs (all labs ordered are listed, but only abnormal results are displayed) Labs Reviewed  URINALYSIS, W/ REFLEX TO CULTURE (INFECTION SUSPECTED) - Abnormal; Notable for the following components:      Result Value   Hgb urine dipstick MODERATE (*)    Ketones, ur 40 (*)    Protein, ur 100 (*)    Leukocytes,Ua TRACE (*)    Bacteria, UA MANY (*)    All other components within normal limits  URINE CULTURE  PREGNANCY, URINE    EKG   Radiology No results found.  Procedures Procedures (including critical care time)  Medications Ordered in UC Medications - No data to display  Initial Impression / Assessment and Plan / UC Course  I have reviewed the triage vital signs and the nursing notes.  Pertinent labs & imaging results that were available during my care of the patient were reviewed by me and considered in my medical decision making (see chart for details).   22 y/o female presents for dysuria, frequency and urgency as well as pinkish tint to urine x 2 days.  Denies fever.  Urinalysis shows moderate hemoglobin, ketones and protein, trace leukocytes and many bacteria.  Will send urine for culture.  Consistent with acute urinary tract  infection when considering patient's symptoms.  Sent Macrobid to  pharmacy.  Encouraged increasing rest and fluids.  Urine pregnancy test negative.   Final Clinical Impressions(s) / UC Diagnoses   Final diagnoses:  Acute cystitis with hematuria  Dysuria  Encounter for pregnancy test, result negative     Discharge Instructions      UTI: Based on either symptoms or urinalysis, you may have a urinary tract infection. We will send the urine for culture and call with results in a few days. Begin antibiotics at this time. Your symptoms should be much improved over the next 2-3 days. Increase rest and fluid intake. If for some reason symptoms are worsening or not improving after a couple of days or the urine culture determines the antibiotics you are taking will not treat the infection, the antibiotics may be changed. Return or go to ER for fever, back pain, worsening urinary pain, discharge, increased blood in urine. May take Tylenol or Motrin OTC for pain relief or consider AZO if no contraindications      ED Prescriptions     Medication Sig Dispense Auth. Provider   nitrofurantoin, macrocrystal-monohydrate, (MACROBID) 100 MG capsule Take 1 capsule (100 mg total) by mouth 2 (two) times daily. 10 capsule Danton Clap, PA-C      PDMP not reviewed this encounter.   Danton Clap, PA-C 05/15/22 1418

## 2022-05-15 NOTE — Discharge Instructions (Signed)

## 2022-05-17 LAB — URINE CULTURE: Culture: 20000 — AB

## 2022-05-19 ENCOUNTER — Telehealth: Payer: Self-pay

## 2022-05-19 NOTE — Telephone Encounter (Signed)
Pt called triage and said Rx for chlamydia not at pharmacy. Called pharmacy and was advised pt picked up Rx on 05/15/22 along with [facility] and UTI Rx's. Called pt back and she said "was that the Rx with 2 pills", I said yes. We are good now.

## 2022-07-17 ENCOUNTER — Ambulatory Visit
Admission: EM | Admit: 2022-07-17 | Discharge: 2022-07-17 | Disposition: A | Discharge status: Home / Self Care | Payer: BC Managed Care – PPO | Attending: Emergency Medicine | Admitting: Emergency Medicine

## 2022-07-17 DIAGNOSIS — B372 Candidiasis of skin and nail: Secondary | ICD-10-CM

## 2022-07-17 MED ORDER — FLUCONAZOLE 150 MG PO TABS: 150.0000 mg | ORAL_TABLET | ORAL | 0 refills | Status: AC

## 2022-07-17 NOTE — ED Provider Notes (Signed)
[facility]    CSN: [id number] Arrival date & time: 07/17/22  1512      History   Chief Complaint Chief Complaint  Patient presents with   Rash    HPI Chelsea Sanchez is a 22 y.o. female.   HPI  22 year old female here for evaluation of rash in both armpits.  She states that it has been going on for the past 2 weeks.  It has been small bumps until 3 days ago when developed into a larger rash.  She has tried over-the-counter Benadryl cream which caused burning.  She has not had any change in personal hygiene products or laundry detergent.  She has stopped using deodorant but it has not improved.  The rash is itchy.  Past Medical History:  Diagnosis Date   History of migraine     Patient Active Problem List   Diagnosis Date Noted   Dysuria 02/16/2021   Acute cystitis with hematuria 02/16/2021    Past Surgical History:  Procedure Laterality Date   NO PAST SURGERIES      OB History     Gravida  0   Para  0   Term  0   Preterm  0   AB  0   Living  0      SAB  0   IAB  0   Ectopic  0   Multiple  0   Live Births  0            Home Medications    Prior to Admission medications   Medication Sig Start Date End Date Taking? Authorizing Provider  fluconazole (DIFLUCAN) 150 MG tablet Take 1 tablet (150 mg total) by mouth every 3 (three) days for 3 doses. 07/17/22 07/24/22 Yes Becky Augusta, NP  Norethindrone Acetate-Ethinyl Estrad-FE (MICROGESTIN 24 FE) 1-20 MG-MCG(24) tablet Take 1 tablet by mouth daily. 05/12/22  Yes Copland, Ilona Sorrel, PA-C    Family History Family History  Problem Relation Age of Onset   Healthy Mother    Multiple sclerosis Father    Hypertension Maternal Grandmother    Breast cancer Maternal Grandmother        not sure of age   Kidney disease Maternal Grandmother     Social History Social History   Tobacco Use   Smoking status: Never   Smokeless tobacco: Never  Vaping Use   Vaping Use: Former    Substances: Flavoring  Substance Use Topics   Alcohol use: Yes    Comment: occasional   Drug use: Never     Allergies   Copper-containing compounds and Nickel   Review of Systems Review of Systems  Constitutional:  Negative for fever.  Skin:  Positive for color change and rash.     Physical Exam Triage Vital Signs ED Triage Vitals [07/17/22 1520]  Enc Vitals Group     BP      Pulse      Resp 16     Temp      Temp Source Oral     SpO2      Weight      Height      Head Circumference      Peak Flow      Pain Score      Pain Loc      Pain Edu?      Excl. in [place]?    No data found.  Updated Vital Signs BP 106/61 (BP Location: Left Arm)   Pulse 66  Temp 98.2 F (36.8 C) (Oral)   Resp 16   Ht 5\' 4"  (1.626 m)   Wt 117 lb 6.4 oz (53.3 kg)   SpO2 100%   BMI 20.15 kg/m   Visual Acuity Right Eye Distance:   Left Eye Distance:   Bilateral Distance:    Right Eye Near:   Left Eye Near:    Bilateral Near:     Physical Exam Vitals and nursing note reviewed.  Constitutional:      Appearance: Normal appearance. She is not ill-appearing.  HENT:     Head: Normocephalic and atraumatic.  Skin:    General: Skin is warm and dry.     Capillary Refill: Capillary refill takes less than 2 seconds.     Findings: Erythema and rash present.  Neurological:     General: No focal deficit present.     Mental Status: She is alert and oriented to person, place, and time.  Psychiatric:        Mood and Affect: Mood normal.        Behavior: Behavior normal.        Thought Content: Thought content normal.        Judgment: Judgment normal.      UC Treatments / Results  Labs (all labs ordered are listed, but only abnormal results are displayed) Labs Reviewed - No data to display  EKG   Radiology No results found.  Procedures Procedures (including critical care time)  Medications Ordered in UC Medications - No data to display  Initial Impression / Assessment and  Plan / UC Course  I have reviewed the triage vital signs and the nursing notes.  Pertinent labs & imaging results that were available during my care of the patient were reviewed by me and considered in my medical decision making (see chart for details).   The patient is a very pleasant, nontoxic-appearing 22 year old female presenting for a rash in both axilla as outlined in HPI above.    As you can see in image above the rash is in both armpits and is erythematous and maculopapular.  It is worse on the left than the right.  Patient reports that the rash intensifies if she gets hot or after she gets a shower.  This is consistent with a skin yeast infection.  I will start her on Diflucan 150 mg tablets and have her take 1 tablet now and repeat dosing every 3 days for total of 3 doses.  If her symptoms do not improve she should return for reevaluation.   Final Clinical Impressions(s) / UC Diagnoses   Final diagnoses:  Candidal intertrigo     Discharge Instructions      Take the Diflucan now and repeat dosing every 3 days for 3 doses.  Keep the rash clean and dry. Leave the areas open to air as much as possible.  If your symptoms do not improve return for re-evaluation.     ED Prescriptions     Medication Sig Dispense Auth. Provider   fluconazole (DIFLUCAN) 150 MG tablet Take 1 tablet (150 mg total) by mouth every 3 (three) days for 3 doses. 3 tablet Becky Augusta, NP      PDMP not reviewed this encounter.   Becky Augusta, NP 07/17/22 1536

## 2022-07-17 NOTE — Discharge Instructions (Signed)
Take the Diflucan now and repeat dosing every 3 days for 3 doses.  Keep the rash clean and dry. Leave the areas open to air as much as possible.  If your symptoms do not improve return for re-evaluation.

## 2022-07-17 NOTE — ED Triage Notes (Signed)
Pt c/o rash under bilateral arm pits x2 wks. States they started off like small bumps x3 days & yesterday they developed into a larger rash. Has tried benadryl cream w/o relief.

## 2022-08-06 ENCOUNTER — Other Ambulatory Visit: Payer: Self-pay | Admitting: Obstetrics and Gynecology

## 2022-08-06 DIAGNOSIS — A749 Chlamydial infection, unspecified: Secondary | ICD-10-CM

## 2022-09-15 ENCOUNTER — Ambulatory Visit
Admission: EM | Admit: 2022-09-15 | Discharge: 2022-09-15 | Disposition: A | Discharge status: Home / Self Care | Payer: BC Managed Care – PPO | Attending: Urgent Care | Admitting: Urgent Care

## 2022-09-15 DIAGNOSIS — H0100B Unspecified blepharitis left eye, upper and lower eyelids: Secondary | ICD-10-CM

## 2022-09-15 DIAGNOSIS — H0014 Chalazion left upper eyelid: Secondary | ICD-10-CM

## 2022-09-15 MED ORDER — DOXYCYCLINE HYCLATE 100 MG PO CAPS: 100.0000 mg | ORAL_CAPSULE | Freq: Two times a day (BID) | ORAL | 0 refills | Status: DC

## 2022-09-15 MED ORDER — ERYTHROMYCIN 5 MG/GM OP OINT: TOPICAL_OINTMENT | OPHTHALMIC | 0 refills | Status: DC

## 2022-09-15 NOTE — ED Triage Notes (Signed)
Patient c/o bump on that left eye that's reoccurring. Used cream and eye drops with no relief. Hurts when touching it.

## 2022-09-15 NOTE — Discharge Instructions (Signed)
I have prescribed erythromycin eye ointment. Use this on your left eye three times daily x 5 days. If the ointment is too hard to administer, hold it in your warm hands for 5 minutes prior and it will act more like a drop. Warm moist washcloths with Laural Benes and Johnson baby shampoo can be used to gently massage and cleans to eyes. Put the ointment on after the compress. Please also start taking the antibiotic prescribed today.  Return to clinic if any fever, eye pain, change in vision. If symptoms persist, please follow up with an ophthalmologist.

## 2022-09-15 NOTE — ED Provider Notes (Signed)
[facility]    CSN: [id number] Arrival date & time: 09/15/22  1711      History   Chief Complaint Chief Complaint  Patient presents with   Eye Problem    HPI Chelsea Sanchez is a 22 y.o. female.   22 year old female presents due to concerns of a left eyelid problem.  She states this has been a recurrent issue for several months.  She reports pain to the upper eyelid and a bump.  She states that they seem to drain at times, and then moved to a separate spot.  It is always on the left eye.  She denies any issues with her eyeball itself.  States that she went to an eye doctor who prescribed her some type of eyedrop, but states that that was ineffective.  She has been trying numerous over-the-counter remedies as well without significant relief.  Denies blurred vision, eye pain, photophobia.  Denies a fever.   Eye Problem   Past Medical History:  Diagnosis Date   History of migraine     Patient Active Problem List   Diagnosis Date Noted   Dysuria 02/16/2021   Acute cystitis with hematuria 02/16/2021    Past Surgical History:  Procedure Laterality Date   NO PAST SURGERIES      OB History     Gravida  0   Para  0   Term  0   Preterm  0   AB  0   Living  0      SAB  0   IAB  0   Ectopic  0   Multiple  0   Live Births  0            Home Medications    Prior to Admission medications   Medication Sig Start Date End Date Taking? Authorizing Provider  doxycycline (VIBRAMYCIN) 100 MG capsule Take 1 capsule (100 mg total) by mouth 2 (two) times daily. 09/15/22  Yes Katilin Raynes L, PA  erythromycin ophthalmic ointment Place a 1/2 inch ribbon of ointment into the left lower eyelid three times daily x 5 days 09/15/22  Yes Mouhamad Teed L, PA  Norethindrone Acetate-Ethinyl Estrad-FE (MICROGESTIN 24 FE) 1-20 MG-MCG(24) tablet Take 1 tablet by mouth daily. 05/12/22  Yes Copland, Ilona Sorrel, PA-C    Family History Family History  Problem  Relation Age of Onset   Healthy Mother    Multiple sclerosis Father    Hypertension Maternal Grandmother    Breast cancer Maternal Grandmother        not sure of age   Kidney disease Maternal Grandmother     Social History Social History   Tobacco Use   Smoking status: Never    Passive exposure: Never   Smokeless tobacco: Never  Vaping Use   Vaping status: Former   Substances: Flavoring  Substance Use Topics   Alcohol use: Yes    Comment: occasional   Drug use: Never     Allergies   Copper-containing compounds and Nickel   Review of Systems Review of Systems As per HPI  Physical Exam Triage Vital Signs ED Triage Vitals  Encounter Vitals Group     BP 09/15/22 1724 114/78     Systolic BP Percentile --      Diastolic BP Percentile --      Pulse Rate 09/15/22 1724 74     Resp 09/15/22 1724 16     Temp 09/15/22 1724 99.2 F (37.3 C)  Temp Source 09/15/22 1724 Oral     SpO2 09/15/22 1724 100 %     Weight 09/15/22 1722 116 lb (52.6 kg)     Height --      Head Circumference --      Peak Flow --      Pain Score 09/15/22 1723 0     Pain Loc --      Pain Education --      Exclude from Growth Chart --    No data found.  Updated Vital Signs BP 114/78 (BP Location: Left Arm)   Pulse 74   Temp 99.2 F (37.3 C) (Oral)   Resp 16   Wt 116 lb (52.6 kg)   SpO2 100%   BMI 19.91 kg/m   Visual Acuity Right Eye Distance:   Left Eye Distance:   Bilateral Distance:    Right Eye Near:   Left Eye Near:    Bilateral Near:     Physical Exam Vitals and nursing note reviewed. Exam conducted with a chaperone present.  Constitutional:      General: She is not in acute distress.    Appearance: Normal appearance. She is normal weight. She is not ill-appearing, toxic-appearing or diaphoretic.  HENT:     Head: Normocephalic and atraumatic.     Right Ear: External ear normal.     Left Ear: External ear normal.     Mouth/Throat:     Mouth: Mucous membranes are  moist.  Eyes:     General: Lids are everted, no foreign bodies appreciated. Vision grossly intact. Gaze aligned appropriately. No allergic shiner, visual field deficit or scleral icterus.       Right eye: No foreign body, discharge or hordeolum.        Left eye: No foreign body, discharge or hordeolum.     Extraocular Movements: Extraocular movements intact.     Right eye: Normal extraocular motion and no nystagmus.     Left eye: Normal extraocular motion and no nystagmus.     Conjunctiva/sclera: Conjunctivae normal.     Right eye: Right conjunctiva is not injected. No chemosis, exudate or hemorrhage.    Left eye: Left conjunctiva is not injected. No chemosis, exudate or hemorrhage.    Pupils: Pupils are equal, round, and reactive to light.     Visual Fields: Right eye visual fields normal and left eye visual fields normal.      Comments: L upper eyelid erythematous, mildly edematous Chalazion noted.  Cardiovascular:     Rate and Rhythm: Normal rate.  Pulmonary:     Effort: Pulmonary effort is normal. No respiratory distress.  Neurological:     Mental Status: She is alert.      UC Treatments / Results  Labs (all labs ordered are listed, but only abnormal results are displayed) Labs Reviewed - No data to display  EKG   Radiology No results found.  Procedures Procedures (including critical care time)  Medications Ordered in UC Medications - No data to display  Initial Impression / Assessment and Plan / UC Course  I have reviewed the triage vital signs and the nursing notes.  Pertinent labs & imaging results that were available during my care of the patient were reviewed by me and considered in my medical decision making (see chart for details).     Chalazion L upper eyelid -handout provided to discussed typical progression and course of a chalazion.  Will have patient continue with the warm moist compresses several times daily,  will also have her take erythromycin eye  ointment 3 times daily. Blepharitis -with concern for possible ocular rosacea.  Will do a trial of doxycycline twice daily for the the next 10 days.  Recommended patient follow-up with her eye specialist should her symptoms persist or worsen   Final Clinical Impressions(s) / UC Diagnoses   Final diagnoses:  Chalazion left upper eyelid  Blepharitis of both upper and lower eyelid of left eye, unspecified type     Discharge Instructions      I have prescribed erythromycin eye ointment. Use this on your left eye three times daily x 5 days. If the ointment is too hard to administer, hold it in your warm hands for 5 minutes prior and it will act more like a drop. Warm moist washcloths with Laural Benes and Johnson baby shampoo can be used to gently massage and cleans to eyes. Put the ointment on after the compress. Please also start taking the antibiotic prescribed today.  Return to clinic if any fever, eye pain, change in vision. If symptoms persist, please follow up with an ophthalmologist.     ED Prescriptions     Medication Sig Dispense Auth. Provider   doxycycline (VIBRAMYCIN) 100 MG capsule Take 1 capsule (100 mg total) by mouth 2 (two) times daily. 20 capsule Karess Harner L, PA   erythromycin ophthalmic ointment Place a 1/2 inch ribbon of ointment into the left lower eyelid three times daily x 5 days 3.5 g Saron Tweed L, PA      PDMP not reviewed this encounter.   Maretta Bees, [place] 09/15/22 1747

## 2023-01-14 NOTE — Progress Notes (Unsigned)
    Pa, [facility]   No chief complaint on file.   HPI:      [name] is a 22 y.o. G0P0000 whose LMP was No LMP recorded., presents today for ***    Patient Active Problem List   Diagnosis Date Noted   Dysuria 02/16/2021   Acute cystitis with hematuria 02/16/2021    Past Surgical History:  Procedure Laterality Date   NO PAST SURGERIES      Family History  Problem Relation Age of Onset   Healthy Mother    Multiple sclerosis Father    Hypertension Maternal Grandmother    Breast cancer Maternal Grandmother        not sure of age   Kidney disease Maternal Grandmother     Social History   Socioeconomic History   Marital status: Single    Spouse name: Not on file   Number of children: Not on file   Years of education: Not on file   Highest education level: Not on file  Occupational History   Not on file  Tobacco Use   Smoking status: Never    Passive exposure: Never   Smokeless tobacco: Never  Vaping Use   Vaping status: Former   Substances: Flavoring  Substance and Sexual Activity   Alcohol use: Yes    Comment: occasional   Drug use: Never   Sexual activity: Yes    Birth control/protection: Condom, Pill  Other Topics Concern   Not on file  Social History Narrative   Not on file   Social Determinants of Health   Financial Resource Strain: Not on file  Food Insecurity: Not on file  Transportation Needs: Not on file  Physical Activity: Not on file  Stress: Not on file  Social Connections: Not on file  Intimate Partner Violence: Not on file    Outpatient Medications Prior to Visit  Medication Sig Dispense Refill   doxycycline (VIBRAMYCIN) 100 MG capsule Take 1 capsule (100 mg total) by mouth 2 (two) times daily. 20 capsule 0   erythromycin ophthalmic ointment Place a 1/2 inch ribbon of ointment into the left lower eyelid three times daily x 5 days 3.5 g 0   Norethindrone Acetate-Ethinyl Estrad-FE (MICROGESTIN 24 FE) 1-20  MG-MCG(24) tablet Take 1 tablet by mouth daily. 84 tablet 3   No facility-administered medications prior to visit.      ROS:  Review of Systems BREAST: No symptoms   OBJECTIVE:   Vitals:  There were no vitals taken for this visit.  Physical Exam  Results: No results found for this or any previous visit (from the past 24 hour(s)).   Assessment/Plan: No diagnosis found.    No orders of the defined types were placed in this encounter.     No follow-ups on file.  Mily Malecki B. Yuli Lanigan, PA-C 01/14/2023 2:47 PM

## 2023-01-15 ENCOUNTER — Ambulatory Visit: Payer: BC Managed Care – PPO | Admitting: Obstetrics and Gynecology

## 2023-01-15 ENCOUNTER — Telehealth: Payer: Self-pay

## 2023-01-15 DIAGNOSIS — Z113 Encounter for screening for infections with a predominantly sexual mode of transmission: Secondary | ICD-10-CM

## 2023-01-15 DIAGNOSIS — A749 Chlamydial infection, unspecified: Secondary | ICD-10-CM

## 2023-01-15 NOTE — Telephone Encounter (Signed)
Pt cancelled today's appt (12/5) Per [facility], pt needed chlamydia TOC and would like for pt to r/s today's appt. Called pt, no answer, LVMTRC.

## 2023-03-24 ENCOUNTER — Ambulatory Visit
Admission: RE | Admit: 2023-03-24 | Discharge: 2023-03-24 | Disposition: A | Discharge status: Home / Self Care | Payer: BC Managed Care – PPO | Source: Ambulatory Visit | Attending: Emergency Medicine | Admitting: Emergency Medicine

## 2023-03-24 VITALS — BP 112/87 | HR 115 | Temp 98.8°F | Resp 16 | Ht 64.0 in | Wt 120.0 lb

## 2023-03-24 DIAGNOSIS — B3731 Acute candidiasis of vulva and vagina: Secondary | ICD-10-CM | POA: Insufficient documentation

## 2023-03-24 DIAGNOSIS — Z113 Encounter for screening for infections with a predominantly sexual mode of transmission: Secondary | ICD-10-CM | POA: Diagnosis present

## 2023-03-24 DIAGNOSIS — N760 Acute vaginitis: Secondary | ICD-10-CM | POA: Insufficient documentation

## 2023-03-24 DIAGNOSIS — B9689 Other specified bacterial agents as the cause of diseases classified elsewhere: Secondary | ICD-10-CM | POA: Diagnosis present

## 2023-03-24 LAB — WET PREP, GENITAL
Sperm: NONE SEEN
Trich, Wet Prep: NONE SEEN
WBC, Wet Prep HPF POC: >10 — AB (ref <10)

## 2023-03-24 MED ORDER — FLUCONAZOLE 150 MG PO TABS: 150.0000 mg | ORAL_TABLET | Freq: Once | ORAL | 1 refills | Status: AC

## 2023-03-24 MED ORDER — METRONIDAZOLE 500 MG PO TABS: 500.0000 mg | ORAL_TABLET | Freq: Two times a day (BID) | ORAL | 0 refills | Status: AC

## 2023-03-24 NOTE — ED Provider Notes (Signed)
HPI  SUBJECTIVE:  Chelsea Sanchez is a 23 y.o. female who presents with 1 month of intermittent, thin, white, odorous vaginal discharge.  No vaginal itching, genital rash, urinary complaints, nausea, vomiting, fevers, abdominal, back, pelvic pain.  She is in a long-term monogamous relationship with a female, who is asymptomatic.  However, she would like to check for STDs.  She has tried showering with improvement in her symptoms.  Symptoms are worse when she does not drink enough water.  She has a past medical history of chlamydia and yeast.  No history of diabetes.  LMP: The beginning of last month.  She denies the possibility of being pregnant.  PCP: her OBGYN.    Past Medical History:  Diagnosis Date   History of migraine     Past Surgical History:  Procedure Laterality Date   NO PAST SURGERIES      Family History  Problem Relation Age of Onset   Healthy Mother    Multiple sclerosis Father    Hypertension Maternal Grandmother    Breast cancer Maternal Grandmother        not sure of age   Kidney disease Maternal Grandmother     Social History   Tobacco Use   Smoking status: Never    Passive exposure: Never   Smokeless tobacco: Never  Vaping Use   Vaping status: Former   Substances: Flavoring  Substance Use Topics   Alcohol use: Yes    Comment: occasional   Drug use: Never    No current facility-administered medications for this encounter.  Current Outpatient Medications:    fluconazole (DIFLUCAN) 150 MG tablet, Take 1 tablet (150 mg total) by mouth once for 1 dose. 1 tab po x 1. May repeat in 72 hours if no improvement, Disp: 2 tablet, Rfl: 1   metroNIDAZOLE (FLAGYL) 500 MG tablet, Take 1 tablet (500 mg total) by mouth 2 (two) times daily for 7 days., Disp: 14 tablet, Rfl: 0   Norethindrone Acetate-Ethinyl Estrad-FE (MICROGESTIN 24 FE) 1-20 MG-MCG(24) tablet, Take 1 tablet by mouth daily., Disp: 84 tablet, Rfl: 3  Allergies  Allergen Reactions    Copper-Containing Compounds    Nickel      ROS  As noted in HPI.   Physical Exam  BP 112/87 (BP Location: Left Arm)   Pulse (!) 115   Temp 98.8 F (37.1 C) (Oral)   Resp 16   Ht 5\' 4"  (1.626 m)   Wt 54.4 kg   SpO2 99%   BMI 20.60 kg/m   Constitutional: Well developed, well nourished, no acute distress Eyes:  EOMI, conjunctiva normal bilaterally HENT: Normocephalic, atraumatic,mucus membranes moist Respiratory: Normal inspiratory effort Cardiovascular: Tachycardic GI: nondistended.  Soft, nontender.  No suprapubic, flank, lower quadrant tenderness Back: No CVAT skin: No rash, skin intact Musculoskeletal: no deformities Neurologic: Alert & oriented x 3, no focal neuro deficits Psychiatric: Speech and behavior appropriate   ED Course   Medications - No data to display  Orders Placed This Encounter  Procedures   Wet prep, genital    Standing Status:   Standing    Number of Occurrences:   1    Results for orders placed or performed during the hospital encounter of 03/24/23 (from the past 24 hours)  Wet prep, genital     Status: Abnormal   Collection Time: 03/24/23  5:40 PM  Result Value Ref Range   Yeast Wet Prep HPF POC PRESENT (A) NONE SEEN   Trich, Wet Prep NONE SEEN  NONE SEEN   Clue Cells Wet Prep HPF POC PRESENT (A) NONE SEEN   WBC, Wet Prep HPF POC >10 (A) <10   Sperm NONE SEEN    No results found.  ED Clinical Impression  1. BV (bacterial vaginosis)   2. Yeast vaginitis   3. Screening for STDs (sexually transmitted diseases)      ED Assessment/Plan    Wet prep positive for BV and yeast.  Home with Flagyl and Diflucan respectively.  STD swab sent.  Deferring treatment until labs are resulted.  Advised patient to refrain from intercourse until symptoms have resolved, she has finished her medications and all of her labs have been resulted.  Follow-up with her OB/GYN as needed.  Discussed labs, MDM, treatment plan, and plan for follow-up with  patient.  patient agrees with plan.   Meds ordered this encounter  Medications   metroNIDAZOLE (FLAGYL) 500 MG tablet    Sig: Take 1 tablet (500 mg total) by mouth 2 (two) times daily for 7 days.    Dispense:  14 tablet    Refill:  0   fluconazole (DIFLUCAN) 150 MG tablet    Sig: Take 1 tablet (150 mg total) by mouth once for 1 dose. 1 tab po x 1. May repeat in 72 hours if no improvement    Dispense:  2 tablet    Refill:  1      *This clinic note was created using Scientist, clinical (histocompatibility and immunogenetics). Therefore, there may be occasional mistakes despite careful proofreading.  ?    Domenick Gong, MD 03/24/23 1811

## 2023-03-24 NOTE — Discharge Instructions (Signed)
Take the medication as written. Give [place] a working phone number so that we can contact you if needed. Refrain from sexual contact until all of your labs have come back, symptoms have resolved, and your partner(s) are treated if necessary.    Go to www.goodrx.com  or www.costplusdrugs.com to look up your medications. This will give you a list of where you can find your prescriptions at the most affordable prices. Or ask the pharmacist what the cash price is, or if they have any other discount programs available to help make your medication more affordable. This can be less expensive than what you would pay with insurance.

## 2023-03-24 NOTE — ED Triage Notes (Signed)
Pt c/o vaginal discharge x1 mon. Concerned for STD.

## 2023-03-25 LAB — CERVICOVAGINAL ANCILLARY ONLY
Chlamydia: NEGATIVE
Comment: NEGATIVE
Comment: NEGATIVE
Comment: NORMAL
Neisseria Gonorrhea: POSITIVE — AB
Trichomonas: NEGATIVE

## 2023-03-26 ENCOUNTER — Telehealth: Payer: Self-pay

## 2023-03-26 ENCOUNTER — Ambulatory Visit
Admission: EM | Admit: 2023-03-26 | Discharge: 2023-03-26 | Disposition: A | Discharge status: Home / Self Care | Payer: BC Managed Care – PPO | Attending: Family Medicine | Admitting: Family Medicine

## 2023-03-26 ENCOUNTER — Encounter: Payer: Self-pay | Admitting: Emergency Medicine

## 2023-03-26 DIAGNOSIS — A549 Gonococcal infection, unspecified: Secondary | ICD-10-CM | POA: Diagnosis not present

## 2023-03-26 MED ORDER — CEFTRIAXONE SODIUM 500 MG IJ SOLR
500.0000 mg | Freq: Once | INTRAMUSCULAR | Status: AC
  Administered 2023-03-26: 500 mg via INTRAMUSCULAR

## 2023-03-26 NOTE — Telephone Encounter (Signed)
Per protocol, pt will need to return to [facility] for Nurse Visit for Rocephin 500mg  IM.  Attempted to reach patient x1. LVM.

## 2023-03-26 NOTE — ED Triage Notes (Signed)
Pt was seen 03/24/2023 she presents for treatment of Gonorrhea.

## 2023-04-18 ENCOUNTER — Other Ambulatory Visit: Payer: Self-pay | Admitting: Obstetrics and Gynecology

## 2023-04-18 DIAGNOSIS — Z3041 Encounter for surveillance of contraceptive pills: Secondary | ICD-10-CM

## 2023-04-27 MED ORDER — MICROGESTIN 24 FE 1-20 MG-MCG PO TABS: 1.0000 | ORAL_TABLET | Freq: Every day | ORAL | 0 refills | Status: DC

## 2023-04-27 NOTE — Telephone Encounter (Signed)
 Patient advised she has scheduled annual for 05/14/23. She is requesting refill of Norethindrone Acetate-Ethinyl Estrad-FE (MICROGESTIN 24 FE) 1-20 MG-MCG(24) tablet . Advised will send.

## 2023-04-27 NOTE — Addendum Note (Signed)
 Addended by: Kathlene Cote on: 04/27/2023 10:52 AM   Modules accepted: Orders

## 2023-05-13 NOTE — Progress Notes (Unsigned)
 PCP:  Pa, [facility]   No chief complaint on file.    HPI:      [name] is a 23 y.o. G0P0000 whose LMP was No LMP recorded. (Menstrual status: Irregular Periods)., presents today for her annual examination.  Her menses are regular every 28-30 days, lasting 4-5 days.  No BTB, mild dysmen, improved with NSAIDs.    Sex activity: single partner, contraception - OCP (estrogen/progesterone). Likes OCPs better than nexplanon; removed 12/23 due to increased headaches and breast tenderness. Hx of migraines without aura.  Last Pap: 05/12/22 Results were NILM Hx of STDs: chlamydia 2024; gonorrhea 2/25, needs TOC  There is a FH of breast cancer in her MGM, genetic testing not indicated. There is no FH of ovarian cancer. The patient does do self-breast exams.  Tobacco use: The patient denies current or previous tobacco use. Alcohol use: social drinker No drug use.  Exercise: not active  She does not get adequate calcium and Vitamin D in her diet.  Patient Active Problem List   Diagnosis Date Noted   Dysuria 02/16/2021   Acute cystitis with hematuria 02/16/2021    Past Surgical History:  Procedure Laterality Date   NO PAST SURGERIES      Family History  Problem Relation Age of Onset   Healthy Mother    Multiple sclerosis Father    Hypertension Maternal Grandmother    Breast cancer Maternal Grandmother        not sure of age   Kidney disease Maternal Grandmother     Social History   Socioeconomic History   Marital status: Single    Spouse name: Not on file   Number of children: Not on file   Years of education: Not on file   Highest education level: Not on file  Occupational History   Not on file  Tobacco Use   Smoking status: Never    Passive exposure: Never   Smokeless tobacco: Never  Vaping Use   Vaping status: Former   Substances: Flavoring  Substance and Sexual Activity   Alcohol use: Yes    Comment: occasional   Drug use: Never    Sexual activity: Yes    Birth control/protection: Condom, Pill  Other Topics Concern   Not on file  Social History Narrative   Not on file   Social Drivers of Health   Financial Resource Strain: Not on file  Food Insecurity: Not on file  Transportation Needs: Not on file  Physical Activity: Not on file  Stress: Not on file  Social Connections: Not on file  Intimate Partner Violence: Not on file     Current Outpatient Medications:    Norethindrone Acetate-Ethinyl Estrad-FE (MICROGESTIN 24 FE) 1-20 MG-MCG(24) tablet, Take 1 tablet by mouth daily., Disp: 28 tablet, Rfl: 0     ROS:  Review of Systems  Constitutional:  Negative for fatigue, fever and unexpected weight change.  Respiratory:  Negative for cough, shortness of breath and wheezing.   Cardiovascular:  Negative for chest pain, palpitations and leg swelling.  Gastrointestinal:  Negative for blood in stool, constipation, diarrhea, nausea and vomiting.  Endocrine: Negative for cold intolerance, heat intolerance and polyuria.  Genitourinary:  Negative for dyspareunia, dysuria, flank pain, frequency, genital sores, hematuria, menstrual problem, pelvic pain, urgency, vaginal bleeding, vaginal discharge and vaginal pain.  Musculoskeletal:  Negative for back pain, joint swelling and myalgias.  Skin:  Negative for rash.  Neurological:  Positive for headaches. Negative for dizziness, syncope, light-headedness  and numbness.  Hematological:  Negative for adenopathy.  Psychiatric/Behavioral:  Negative for agitation, confusion, sleep disturbance and suicidal ideas. The patient is not nervous/anxious.    BREAST: No symptoms   Objective: There were no vitals taken for this visit.   Physical Exam Constitutional:      Appearance: She is well-developed.  Genitourinary:     Vulva normal.     Right Labia: No rash, tenderness or lesions.    Left Labia: No tenderness, lesions or rash.    No vaginal discharge, erythema or  tenderness.      Right Adnexa: not tender and no mass present.    Left Adnexa: not tender and no mass present.    No cervical friability or polyp.     Uterus is not enlarged or tender.  Breasts:    Right: No mass, nipple discharge, skin change or tenderness.     Left: No mass, nipple discharge, skin change or tenderness.  Neck:     Thyroid: No thyromegaly.  Cardiovascular:     Rate and Rhythm: Normal rate and regular rhythm.     Heart sounds: Normal heart sounds. No murmur heard. Pulmonary:     Effort: Pulmonary effort is normal.     Breath sounds: Normal breath sounds.  Abdominal:     Palpations: Abdomen is soft.     Tenderness: There is no abdominal tenderness. There is no guarding or rebound.  Musculoskeletal:        General: Normal range of motion.     Cervical back: Normal range of motion.  Lymphadenopathy:     Cervical: No cervical adenopathy.  Neurological:     General: No focal deficit present.     Mental Status: She is alert and oriented to person, place, and time.     Cranial Nerves: No cranial nerve deficit.  Skin:    General: Skin is warm and dry.  Psychiatric:        Mood and Affect: Mood normal.        Behavior: Behavior normal.        Thought Content: Thought content normal.        Judgment: Judgment normal.  Vitals reviewed.     Assessment/Plan: Encounter for annual routine gynecological examination  Cervical cancer screening - Plan: Cytology - PAP  Screening for STD (sexually transmitted disease) - Plan: Cytology - PAP  Encounter for surveillance of contraceptive pills - Plan: Norethindrone Acetate-Ethinyl Estrad-FE (MICROGESTIN 24 FE) 1-20 MG-MCG(24) tablet; OCP RF; discussed non-daily options prn  No orders of the defined types were placed in this encounter.            GYN counsel adequate intake of calcium and vitamin D, diet and exercise     F/U  No follow-ups on file.  Tyisha Cressy B. Mackie Holness, PA-C 05/13/2023 9:00 PM

## 2023-05-14 ENCOUNTER — Other Ambulatory Visit (HOSPITAL_COMMUNITY)
Admission: RE | Admit: 2023-05-14 | Discharge: 2023-05-14 | Disposition: A | Discharge status: Home / Self Care | Payer: Self-pay | Source: Ambulatory Visit | Attending: Obstetrics and Gynecology | Admitting: Obstetrics and Gynecology

## 2023-05-14 ENCOUNTER — Encounter: Payer: Self-pay | Admitting: Obstetrics and Gynecology

## 2023-05-14 ENCOUNTER — Ambulatory Visit (INDEPENDENT_AMBULATORY_CARE_PROVIDER_SITE_OTHER): Payer: Self-pay | Admitting: Obstetrics and Gynecology

## 2023-05-14 VITALS — BP 104/68 | Ht 64.0 in | Wt 117.0 lb

## 2023-05-14 DIAGNOSIS — Z113 Encounter for screening for infections with a predominantly sexual mode of transmission: Secondary | ICD-10-CM

## 2023-05-14 DIAGNOSIS — Z3041 Encounter for surveillance of contraceptive pills: Secondary | ICD-10-CM

## 2023-05-14 DIAGNOSIS — A549 Gonococcal infection, unspecified: Secondary | ICD-10-CM

## 2023-05-14 DIAGNOSIS — Z01419 Encounter for gynecological examination (general) (routine) without abnormal findings: Secondary | ICD-10-CM

## 2023-05-14 MED ORDER — MICROGESTIN 24 FE 1-20 MG-MCG PO TABS: 1.0000 | ORAL_TABLET | Freq: Every day | ORAL | 3 refills | Status: DC

## 2023-05-14 NOTE — Patient Instructions (Signed)
 I value your feedback and you entrusting [place] with your care. If you get a [place] patient survey, I would appreciate you taking the time to let us know about your experience today. Thank you! ? ? ?

## 2023-05-18 LAB — CERVICOVAGINAL ANCILLARY ONLY
Chlamydia: NEGATIVE
Comment: NEGATIVE
Comment: NEGATIVE
Comment: NORMAL
Neisseria Gonorrhea: NEGATIVE
Trichomonas: NEGATIVE

## 2023-07-11 ENCOUNTER — Ambulatory Visit: Payer: Self-pay

## 2023-10-01 ENCOUNTER — Ambulatory Visit
Admission: RE | Admit: 2023-10-01 | Discharge: 2023-10-01 | Disposition: A | Discharge status: Home / Self Care | Payer: Self-pay | Source: Ambulatory Visit | Attending: Emergency Medicine | Admitting: Emergency Medicine

## 2023-10-01 VITALS — BP 123/76 | HR 75 | Temp 98.6°F | Resp 16

## 2023-10-01 DIAGNOSIS — Z9189 Other specified personal risk factors, not elsewhere classified: Secondary | ICD-10-CM | POA: Diagnosis not present

## 2023-10-01 DIAGNOSIS — Z113 Encounter for screening for infections with a predominantly sexual mode of transmission: Secondary | ICD-10-CM | POA: Insufficient documentation

## 2023-10-01 DIAGNOSIS — Z202 Contact with and (suspected) exposure to infections with a predominantly sexual mode of transmission: Secondary | ICD-10-CM | POA: Insufficient documentation

## 2023-10-01 LAB — HIV ANTIBODY (ROUTINE TESTING W REFLEX): HIV Screen 4th Generation wRfx: NONREACTIVE

## 2023-10-01 NOTE — Discharge Instructions (Addendum)
 Check my chart for results. Avoid sexual activity until results,treatment known and completed. Safe sex with all future sexual activity. We have sent testing for sexually transmitted infections. We will notify you of any positive results once they are received. If required, we will prescribe any medications you might need.  If your HIV test is positive you will be referred to infectious disease for further management , you will not be notified if your results are negative.     Follow up with PCP. Return as needed.

## 2023-10-01 NOTE — ED Provider Notes (Signed)
 [facility]    CSN: [id number] Arrival date & time: 10/01/23  1403      History   Chief Complaint Chief Complaint  Patient presents with   Exposure to STD    Entered by patient    HPI Chelsea Sanchez is a 23 y.o. female.   23 year old female, Chelsea Sanchez, presents to urgent care for evaluation and routine STI testing.  Patient denies any vaginal discharge or symptoms, new partner.  The history is provided by the patient. No language interpreter was used.    Past Medical History:  Diagnosis Date   History of migraine     Patient Active Problem List   Diagnosis Date Noted   Screen for STD (sexually transmitted disease) 10/01/2023   At risk for sexually transmitted disease due to unprotected sex 10/01/2023   Dysuria 02/16/2021   Acute cystitis with hematuria 02/16/2021    Past Surgical History:  Procedure Laterality Date   NO PAST SURGERIES      OB History     Gravida  0   Para  0   Term  0   Preterm  0   AB  0   Living  0      SAB  0   IAB  0   Ectopic  0   Multiple  0   Live Births  0            Home Medications    Prior to Admission medications   Medication Sig Start Date End Date Taking? Authorizing Provider  HAILEY 24 FE 1-20 MG-MCG(24) tablet Take 1 tablet by mouth daily. 08/19/23  Yes [provider]    Family History Family History  Problem Relation Age of Onset   Healthy Mother    Multiple sclerosis Father    Hypertension Maternal Grandmother    Breast cancer Maternal Grandmother        not sure of age   Kidney disease Maternal Grandmother     Social History Social History   Tobacco Use   Smoking status: Never    Passive exposure: Never   Smokeless tobacco: Never  Vaping Use   Vaping status: Former   Substances: Flavoring  Substance Use Topics   Alcohol use: Yes    Comment: occasional   Drug use: Never     Allergies   Copper-containing compounds and Nickel   Review of  Systems Review of Systems  Constitutional:  Negative for fever.  Genitourinary:  Negative for vaginal discharge.  All other systems reviewed and are negative.    Physical Exam Triage Vital Signs ED Triage Vitals [10/01/23 1441]  Encounter Vitals Group     BP 123/76     Girls Systolic BP Percentile      Girls Diastolic BP Percentile      Boys Systolic BP Percentile      Boys Diastolic BP Percentile      Pulse Rate 75     Resp 16     Temp 98.6 F (37 C)     Temp Source Oral     SpO2 99 %     Weight      Height      Head Circumference      Peak Flow      Pain Score 0     Pain Loc      Pain Education      Exclude from Growth Chart    No data found.  Updated Vital Signs BP  123/76 (BP Location: Right Arm)   Pulse 75   Temp 98.6 F (37 C) (Oral)   Resp 16   LMP 09/08/2023 (Approximate)   SpO2 99%   Visual Acuity Right Eye Distance:   Left Eye Distance:   Bilateral Distance:    Right Eye Near:   Left Eye Near:    Bilateral Near:     Physical Exam Vitals and nursing note reviewed.  Constitutional:      General: She is not in acute distress.    Appearance: She is well-developed.  HENT:     Head: Normocephalic and atraumatic.  Eyes:     Conjunctiva/sclera: Conjunctivae normal.  Cardiovascular:     Rate and Rhythm: Normal rate.     Heart sounds: No murmur heard. Pulmonary:     Effort: Pulmonary effort is normal. No respiratory distress.  Abdominal:     Palpations: Abdomen is soft.     Tenderness: There is no abdominal tenderness.  Musculoskeletal:        General: No swelling.     Cervical back: Neck supple.  Skin:    General: Skin is warm and dry.     Capillary Refill: Capillary refill takes less than 2 seconds.  Neurological:     General: No focal deficit present.     Mental Status: She is alert and oriented to person, place, and time.     GCS: GCS eye subscore is 4. GCS verbal subscore is 5. GCS motor subscore is 6.  Psychiatric:        Attention  and Perception: Attention normal.        Mood and Affect: Mood normal.        Speech: Speech normal.        Behavior: Behavior normal. Behavior is cooperative.      UC Treatments / Results  Labs (all labs ordered are listed, but only abnormal results are displayed) Labs Reviewed  HIV ANTIBODY (ROUTINE TESTING W REFLEX)  RPR  CERVICOVAGINAL ANCILLARY ONLY    EKG   Radiology No results found.  Procedures Procedures (including critical care time)  Medications Ordered in UC Medications - No data to display  Initial Impression / Assessment and Plan / UC Course  I have reviewed the triage vital signs and the nursing notes.  Pertinent labs & imaging results that were available during my care of the patient were reviewed by me and considered in my medical decision making (see chart for details).    Discussed exam findings and plan of care with patient, safe sex precautions, strict go to ER precautions given.   Patient verbalized understanding to this provider.  Ddx: Routine STI testing Final Clinical Impressions(s) / UC Diagnoses   Final diagnoses:  Screen for STD (sexually transmitted disease)  At risk for sexually transmitted disease due to unprotected sex     Discharge Instructions      Check my chart for results. Avoid sexual activity until results,treatment known and completed. Safe sex with all future sexual activity. We have sent testing for sexually transmitted infections. We will notify you of any positive results once they are received. If required, we will prescribe any medications you might need.  If your HIV test is positive you will be referred to infectious disease for further management , you will not be notified if your results are negative.     Follow up with PCP. Return as needed.     ED Prescriptions   None    PDMP not  reviewed this encounter.   Aminta Loose, NP 10/01/23 [phone]

## 2023-10-01 NOTE — ED Triage Notes (Signed)
 Pt would like to be tested for STDs, no symptoms at this time.

## 2023-10-02 ENCOUNTER — Ambulatory Visit (HOSPITAL_COMMUNITY): Payer: Self-pay

## 2023-10-02 LAB — CERVICOVAGINAL ANCILLARY ONLY
Chlamydia: POSITIVE — AB
Comment: NEGATIVE
Comment: NEGATIVE
Comment: NORMAL
Neisseria Gonorrhea: NEGATIVE
Trichomonas: POSITIVE — AB

## 2023-10-02 LAB — RPR: RPR Ser Ql: NONREACTIVE

## 2023-10-02 MED ORDER — DOXYCYCLINE HYCLATE 100 MG PO CAPS: 100.0000 mg | ORAL_CAPSULE | Freq: Two times a day (BID) | ORAL | 0 refills | Status: AC

## 2023-10-02 MED ORDER — METRONIDAZOLE 500 MG PO TABS: 500.0000 mg | ORAL_TABLET | Freq: Two times a day (BID) | ORAL | 0 refills | Status: AC

## 2023-11-11 ENCOUNTER — Ambulatory Visit
Admission: EM | Admit: 2023-11-11 | Discharge: 2023-11-11 | Disposition: A | Discharge status: Home / Self Care | Attending: Physician Assistant | Admitting: Physician Assistant

## 2023-11-11 DIAGNOSIS — N898 Other specified noninflammatory disorders of vagina: Secondary | ICD-10-CM | POA: Insufficient documentation

## 2023-11-11 DIAGNOSIS — R591 Generalized enlarged lymph nodes: Secondary | ICD-10-CM | POA: Insufficient documentation

## 2023-11-11 DIAGNOSIS — R070 Pain in throat: Secondary | ICD-10-CM | POA: Insufficient documentation

## 2023-11-11 DIAGNOSIS — Z113 Encounter for screening for infections with a predominantly sexual mode of transmission: Secondary | ICD-10-CM | POA: Insufficient documentation

## 2023-11-11 NOTE — Discharge Instructions (Signed)
-  I'll call you tomorrow about setting up the [place]  -Testing for STIs and BV/yeast. Will call with any positive results and treat at that time
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
## 2023-11-11 NOTE — ED Provider Notes (Signed)
 [facility]    CSN: [id number] Arrival date & time: 11/11/23  1824      History   Chief Complaint Chief Complaint  Patient presents with   Mass    HPI Chelsea Sanchez is a 23 y.o. female presenting for 1 year history of swollen area of right posterior neck. She reports she she recently noticed swelling under her chin and has pain in this area and in the front of her throat when swallowing. She says it feels a little difficult to swallow. Denies sore throat. No fevers, cough, congestion, chest pain, or breathing issues.   Patient also reports malodorous vaginal discharge x 2 days. She says she has only been sexually active with one person, a female. She does have a history of trichomonas, chlamydia, and gonorrhea this year. Denies abdominal pain or urinary symptoms.   HPI  Past Medical History:  Diagnosis Date   History of migraine     Patient Active Problem List   Diagnosis Date Noted   Screen for STD (sexually transmitted disease) 10/01/2023   At risk for sexually transmitted disease due to unprotected sex 10/01/2023   Dysuria 02/16/2021   Acute cystitis with hematuria 02/16/2021    Past Surgical History:  Procedure Laterality Date   NO PAST SURGERIES      OB History     Gravida  0   Para  0   Term  0   Preterm  0   AB  0   Living  0      SAB  0   IAB  0   Ectopic  0   Multiple  0   Live Births  0            Home Medications    Prior to Admission medications   Medication Sig Start Date End Date Taking? Authorizing Provider  doxycycline  (VIBRAMYCIN ) 100 MG capsule Take 1 capsule (100 mg total) by mouth 2 (two) times daily. 10/02/23   Vonna Sharlet POUR, MD  HAILEY 24 FE 1-20 MG-MCG(24) tablet Take 1 tablet by mouth daily. 08/19/23   [provider]  metroNIDAZOLE  (FLAGYL ) 500 MG tablet Take 1 tablet (500 mg total) by mouth 2 (two) times daily. 10/02/23   Vonna Sharlet POUR, MD    Family History Family History   Problem Relation Age of Onset   Healthy Mother    Multiple sclerosis Father    Hypertension Maternal Grandmother    Breast cancer Maternal Grandmother        not sure of age   Kidney disease Maternal Grandmother     Social History Social History   Tobacco Use   Smoking status: Never    Passive exposure: Never   Smokeless tobacco: Never  Vaping Use   Vaping status: Former   Substances: Flavoring  Substance Use Topics   Alcohol use: Yes    Comment: occasional   Drug use: Never     Allergies   Copper-containing compounds and Nickel   Review of Systems Review of Systems  Constitutional:  Negative for chills, diaphoresis, fatigue and fever.  HENT:  Positive for trouble swallowing. Negative for congestion, ear pain, mouth sores, rhinorrhea and sore throat.   Eyes:  Negative for redness.  Respiratory:  Negative for cough and shortness of breath.   Cardiovascular:  Negative for chest pain.  Gastrointestinal:  Negative for abdominal pain, nausea and vomiting.  Genitourinary:  Positive for vaginal discharge. Negative for dysuria, flank pain, frequency, hematuria, urgency,  vaginal bleeding and vaginal pain.  Musculoskeletal:  Positive for back pain and neck pain. Negative for arthralgias, myalgias and neck stiffness.  Skin:  Negative for rash.  Neurological:  Negative for weakness and headaches.  Hematological:  Positive for adenopathy.     Physical Exam Triage Vital Signs ED Triage Vitals  Encounter Vitals Group     BP 11/11/23 1838 107/70     Girls Systolic BP Percentile --      Girls Diastolic BP Percentile --      Boys Systolic BP Percentile --      Boys Diastolic BP Percentile --      Pulse Rate 11/11/23 1838 65     Resp 11/11/23 1838 16     Temp 11/11/23 1838 98.5 F (36.9 C)     Temp Source 11/11/23 1838 Oral     SpO2 11/11/23 1838 98 %     Weight 11/11/23 1837 120 lb (54.4 kg)     Height 11/11/23 1837 5' 4 (1.626 m)     Head Circumference --      Peak  Flow --      Pain Score 11/11/23 1841 0     Pain Loc --      Pain Education --      Exclude from Growth Chart --    No data found.  Updated Vital Signs BP 107/70 (BP Location: Right Arm)   Pulse 65   Temp 98.5 F (36.9 C) (Oral)   Resp 16   Ht 5' 4 (1.626 m)   Wt 120 lb (54.4 kg)   LMP 11/02/2023 (Approximate)   SpO2 98%   BMI 20.60 kg/m    Physical Exam Vitals and nursing note reviewed.  Constitutional:      General: She is not in acute distress.    Appearance: Normal appearance. She is not ill-appearing or toxic-appearing.  HENT:     Head: Normocephalic and atraumatic.     Nose: Nose normal.     Mouth/Throat:     Mouth: Mucous membranes are moist.     Pharynx: Oropharynx is clear. No posterior oropharyngeal erythema.  Eyes:     General: No scleral icterus.       Right eye: No discharge.        Left eye: No discharge.     Conjunctiva/sclera: Conjunctivae normal.  Neck:     Comments: There is one swollen right posterior lymph node, 1 submandibular swollen lymph node and multiple small left anterior cervical lymph nodes Cardiovascular:     Rate and Rhythm: Normal rate and regular rhythm.     Heart sounds: Normal heart sounds.  Pulmonary:     Effort: Pulmonary effort is normal. No respiratory distress.     Breath sounds: Normal breath sounds.  Musculoskeletal:     Cervical back: Neck supple.  Lymphadenopathy:     Cervical: Cervical adenopathy present.  Skin:    General: Skin is dry.  Neurological:     General: No focal deficit present.     Mental Status: She is alert. Mental status is at baseline.     Motor: No weakness.     Gait: Gait normal.  Psychiatric:        Mood and Affect: Mood normal.        Behavior: Behavior normal.      UC Treatments / Results  Labs (all labs ordered are listed, but only abnormal results are displayed) Labs Reviewed  CBC WITH DIFFERENTIAL/PLATELET - Abnormal; Notable for the  following components:      Result Value    Neutro Abs 1.6 (*)    All other components within normal limits  MONONUCLEOSIS SCREEN  CERVICOVAGINAL ANCILLARY ONLY  CYTOLOGY, (ORAL, ANAL, URETHRAL) ANCILLARY ONLY  CERVICOVAGINAL ANCILLARY ONLY    EKG   Radiology No results found.  Procedures Procedures (including critical care time)  Medications Ordered in UC Medications - No data to display  Initial Impression / Assessment and Plan / UC Course  I have reviewed the triage vital signs and the nursing notes.  Pertinent labs & imaging results that were available during my care of the patient were reviewed by me and considered in my medical decision making (see chart for details).  Clinical Course as of 11/12/23 0829  Wed Nov 11, 2023  1908 Cervicovaginal ancillary only [LE]    Clinical Course User Index [LE] Arvis Jolan N[place], [place]   23 y/o female presents for a swollen lymph node of the right posterior neck x 1 year. She recently noticed swelling under the chin. Reports difficulty and pain swallowing, but denies sore throat. Also reports vaginal discharge with odor. History of STIs.  On exam, she has 1 swollen right posterior, swollen submandibular and multiple swollen left anterior cervical lymph nodes.   CBC and monotest ordered.  CBC normal.  Mono negative.  Will order US  of neck to evaluate 1 year history of lymphadenopathy. Patient will have this done outpatient 11/13/23 and will be called with results. Explained to patient that US  will likely show normal findings with her lymph nodes but if it does not we will refer appropriately.   Cervicovaginal swab obtained for GC/chlamydia/trich/BV and yeast ordered. Cytology swab of throat performed to check for GC/chlamydia. Awaiting results. Will treat with any positive results.   Advised PCP follow up.   Final Clinical Impressions(s) / UC Diagnoses   Final diagnoses:  Lymphadenopathy of head and neck  Throat pain  Routine screening for STI (sexually transmitted  infection)  Vaginal discharge     Discharge Instructions      -I'll call you tomorrow about setting up the US  -Testing for STIs and BV/yeast. Will call with any positive results and treat at that time     ED Prescriptions   None    PDMP not reviewed this encounter.   Arvis Jolan NOVAK, PA-C 11/12/23 1116

## 2023-11-12 ENCOUNTER — Ambulatory Visit (HOSPITAL_COMMUNITY): Payer: Self-pay

## 2023-11-12 ENCOUNTER — Telehealth: Payer: Self-pay | Admitting: Physician Assistant

## 2023-11-12 LAB — CERVICOVAGINAL ANCILLARY ONLY
Bacterial Vaginitis (gardnerella): POSITIVE — AB
Candida Glabrata: NEGATIVE
Candida Vaginitis: POSITIVE — AB
Chlamydia: NEGATIVE
Comment: NEGATIVE
Comment: NEGATIVE
Comment: NEGATIVE
Comment: NEGATIVE
Comment: NEGATIVE
Comment: NORMAL
Neisseria Gonorrhea: NEGATIVE
Trichomonas: NEGATIVE

## 2023-11-12 LAB — MONONUCLEOSIS SCREEN: Mono Screen: NEGATIVE

## 2023-11-12 LAB — CYTOLOGY, (ORAL, ANAL, URETHRAL) ANCILLARY ONLY
Chlamydia: NEGATIVE
Comment: NEGATIVE
Comment: NORMAL
Neisseria Gonorrhea: NEGATIVE

## 2023-11-12 MED ORDER — METRONIDAZOLE 500 MG PO TABS: 500.0000 mg | ORAL_TABLET | Freq: Two times a day (BID) | ORAL | 0 refills | Status: AC

## 2023-11-12 MED ORDER — FLUCONAZOLE 150 MG PO TABS
150.0000 mg | ORAL_TABLET | Freq: Once | ORAL | 0 refills | Status: AC
Start: 2023-11-12 — End: 2023-11-12

## 2023-11-12 NOTE — Telephone Encounter (Signed)
 Ordered US  neck for 1 yr history of neck lymphadenopathy. Patient will have this performed at [facility].

## 2023-11-13 ENCOUNTER — Ambulatory Visit
Admission: RE | Admit: 2023-11-13 | Discharge: 2023-11-13 | Disposition: A | Discharge status: Home / Self Care | Payer: Self-pay | Source: Ambulatory Visit | Attending: Physician Assistant | Admitting: Physician Assistant

## 2023-11-13 ENCOUNTER — Ambulatory Visit (HOSPITAL_COMMUNITY): Payer: Self-pay

## 2023-11-13 DIAGNOSIS — R590 Localized enlarged lymph nodes: Secondary | ICD-10-CM | POA: Insufficient documentation

## 2023-11-13 LAB — CBC WITH DIFFERENTIAL/PLATELET
Abs Immature Granulocytes: 0.01 K/uL (ref 0.00–0.07)
Basophils Absolute: 0 K/uL (ref 0.0–0.1)
Basophils Relative: 1 %
Eosinophils Absolute: 0.1 K/uL (ref 0.0–0.5)
Eosinophils Relative: 1 %
HCT: 38 % (ref 36.0–46.0)
Hemoglobin: 12.6 g/dL (ref 12.0–15.0)
Immature Granulocytes: 0 %
Lymphocytes Relative: 59 %
Lymphs Abs: 3.2 K/uL (ref 0.7–4.0)
MCH: 30.4 pg (ref 26.0–34.0)
MCHC: 33.2 g/dL (ref 30.0–36.0)
MCV: 91.8 fL (ref 80.0–100.0)
Monocytes Absolute: 0.5 K/uL (ref 0.1–1.0)
Monocytes Relative: 9 %
Neutro Abs: 1.6 K/uL — ABNORMAL LOW (ref 1.7–7.7)
Neutrophils Relative %: 30 %
Platelets: 254 K/uL (ref 150–400)
RBC: 4.14 MIL/uL (ref 3.87–5.11)
RDW: 11.9 % (ref 11.5–15.5)
WBC: 5.4 K/uL (ref 4.0–10.5)
nRBC: 0 % (ref 0.0–0.2)

## 2023-11-25 ENCOUNTER — Ambulatory Visit: Admitting: Family Medicine

## 2023-12-01 ENCOUNTER — Ambulatory Visit: Admitting: Family Medicine
